# Patient Record
Sex: Male | Born: 1970 | Race: White | Hispanic: No | Smoking: Never smoker
Health system: Southern US, Community
[De-identification: ages and names within clinical notes are randomized; demographics above are authoritative.]

## PROBLEM LIST (undated history)

## (undated) DIAGNOSIS — J45909 Unspecified asthma, uncomplicated: Secondary | ICD-10-CM

## (undated) DIAGNOSIS — G4733 Obstructive sleep apnea (adult) (pediatric): Secondary | ICD-10-CM

## (undated) DIAGNOSIS — I10 Essential (primary) hypertension: Secondary | ICD-10-CM

## (undated) DIAGNOSIS — E785 Hyperlipidemia, unspecified: Secondary | ICD-10-CM

## (undated) HISTORY — DX: Hyperlipidemia, unspecified: E78.5

## (undated) HISTORY — DX: Essential (primary) hypertension: I10

## (undated) HISTORY — DX: Unspecified asthma, uncomplicated: J45.909

## (undated) HISTORY — DX: Obstructive sleep apnea (adult) (pediatric): G47.33

---

## 1998-10-03 HISTORY — PX: NASAL SINUS SURGERY: SHX719

## 1999-10-07 ENCOUNTER — Encounter: Payer: Self-pay | Admitting: Emergency Medicine

## 1999-10-07 ENCOUNTER — Inpatient Hospital Stay (HOSPITAL_COMMUNITY): Admission: EM | Admit: 1999-10-07 | Discharge: 1999-10-08 | Payer: Self-pay | Admitting: Emergency Medicine

## 1999-11-06 ENCOUNTER — Emergency Department (HOSPITAL_COMMUNITY): Admission: EM | Admit: 1999-11-06 | Discharge: 1999-11-06 | Payer: Self-pay | Admitting: Emergency Medicine

## 1999-11-07 ENCOUNTER — Encounter: Payer: Self-pay | Admitting: Emergency Medicine

## 1999-12-20 ENCOUNTER — Encounter: Payer: Self-pay | Admitting: *Deleted

## 1999-12-20 ENCOUNTER — Ambulatory Visit (HOSPITAL_COMMUNITY): Admission: RE | Admit: 1999-12-20 | Discharge: 1999-12-20 | Payer: Self-pay | Admitting: *Deleted

## 2000-02-25 ENCOUNTER — Encounter: Payer: Self-pay | Admitting: *Deleted

## 2000-02-25 ENCOUNTER — Ambulatory Visit (HOSPITAL_COMMUNITY): Admission: RE | Admit: 2000-02-25 | Discharge: 2000-02-25 | Payer: Self-pay | Admitting: *Deleted

## 2000-04-14 ENCOUNTER — Encounter: Admission: RE | Admit: 2000-04-14 | Discharge: 2000-04-14 | Payer: Self-pay | Admitting: Otolaryngology

## 2000-04-14 ENCOUNTER — Encounter: Payer: Self-pay | Admitting: Otolaryngology

## 2000-06-22 ENCOUNTER — Encounter (INDEPENDENT_AMBULATORY_CARE_PROVIDER_SITE_OTHER): Payer: Self-pay | Admitting: Specialist

## 2000-06-22 ENCOUNTER — Other Ambulatory Visit: Admission: RE | Admit: 2000-06-22 | Discharge: 2000-06-22 | Payer: Self-pay | Admitting: Otolaryngology

## 2006-02-02 ENCOUNTER — Encounter: Admission: RE | Admit: 2006-02-02 | Discharge: 2006-02-02 | Payer: Self-pay | Admitting: Endocrinology

## 2006-02-23 ENCOUNTER — Encounter: Admission: RE | Admit: 2006-02-23 | Discharge: 2006-02-23 | Payer: Self-pay | Admitting: Endocrinology

## 2009-12-11 ENCOUNTER — Encounter: Admission: RE | Admit: 2009-12-11 | Discharge: 2009-12-11 | Payer: Self-pay | Admitting: Family Medicine

## 2012-09-20 ENCOUNTER — Other Ambulatory Visit: Payer: Self-pay | Admitting: Family Medicine

## 2012-09-20 DIAGNOSIS — R7989 Other specified abnormal findings of blood chemistry: Secondary | ICD-10-CM

## 2012-09-24 ENCOUNTER — Ambulatory Visit
Admission: RE | Admit: 2012-09-24 | Discharge: 2012-09-24 | Disposition: A | Discharge status: Home / Self Care | Payer: BC Managed Care – PPO | Source: Ambulatory Visit | Attending: Family Medicine | Admitting: Family Medicine

## 2012-09-24 DIAGNOSIS — R7989 Other specified abnormal findings of blood chemistry: Secondary | ICD-10-CM

## 2012-11-30 ENCOUNTER — Ambulatory Visit
Admission: RE | Admit: 2012-11-30 | Discharge: 2012-11-30 | Disposition: A | Discharge status: Home / Self Care | Payer: BC Managed Care – PPO | Source: Ambulatory Visit | Attending: Family Medicine | Admitting: Family Medicine

## 2012-11-30 ENCOUNTER — Other Ambulatory Visit: Payer: Self-pay | Admitting: Family Medicine

## 2012-11-30 DIAGNOSIS — R109 Unspecified abdominal pain: Secondary | ICD-10-CM

## 2012-11-30 MED ORDER — IOHEXOL 300 MG/ML  SOLN
125.0000 mL | Freq: Once | INTRAMUSCULAR | Status: AC | PRN
  Administered 2012-11-30: 125 mL via INTRAVENOUS

## 2013-01-04 ENCOUNTER — Institutional Professional Consult (permissible substitution): Payer: BC Managed Care – PPO | Admitting: Internal Medicine

## 2013-01-11 ENCOUNTER — Ambulatory Visit (INDEPENDENT_AMBULATORY_CARE_PROVIDER_SITE_OTHER)
Admission: RE | Admit: 2013-01-11 | Discharge: 2013-01-11 | Disposition: A | Discharge status: Home / Self Care | Payer: BC Managed Care – PPO | Source: Ambulatory Visit | Attending: Internal Medicine | Admitting: Internal Medicine

## 2013-01-11 ENCOUNTER — Encounter: Payer: Self-pay | Admitting: Internal Medicine

## 2013-01-11 ENCOUNTER — Ambulatory Visit (INDEPENDENT_AMBULATORY_CARE_PROVIDER_SITE_OTHER): Payer: BC Managed Care – PPO | Admitting: Internal Medicine

## 2013-01-11 ENCOUNTER — Other Ambulatory Visit (INDEPENDENT_AMBULATORY_CARE_PROVIDER_SITE_OTHER): Payer: BC Managed Care – PPO

## 2013-01-11 VITALS — BP 126/82 | HR 87 | Temp 98.0°F | Ht 78.0 in | Wt 282.0 lb

## 2013-01-11 DIAGNOSIS — J45909 Unspecified asthma, uncomplicated: Secondary | ICD-10-CM

## 2013-01-11 DIAGNOSIS — I10 Essential (primary) hypertension: Secondary | ICD-10-CM

## 2013-01-11 LAB — CBC WITH DIFFERENTIAL/PLATELET
Basophils Relative: 0.6 % (ref 0.0–3.0)
Eosinophils Relative: 4.1 % (ref 0.0–5.0)
Lymphs Abs: 1.5 10*3/uL (ref 0.7–4.0)
Monocytes Absolute: 0.5 10*3/uL (ref 0.1–1.0)
Neutrophils Relative %: 66.5 % (ref 43.0–77.0)
Platelets: 214 10*3/uL (ref 150.0–400.0)
WBC: 6.8 10*3/uL (ref 4.5–10.5)

## 2013-01-11 LAB — ALLERGY PROFILE REGION II-DC, DE, MD, ~~LOC~~, VA
Alternaria Alternata: <0.1 kU/L
Aspergillus fumigatus, m3: <0.1 kU/L
Box Elder IgE: 0.69 kU/L — ABNORMAL HIGH
Cat Dander: <0.1 kU/L
Cladosporium Herbarum: <0.1 kU/L
Common Ragweed: <0.1 kU/L
D. farinae: <0.1 kU/L
Elm IgE: <0.1 kU/L
Lamb's Quarters: <0.1 kU/L
Pecan/Hickory Tree IgE: <0.1 kU/L

## 2013-01-11 MED ORDER — OLMESARTAN MEDOXOMIL 20 MG PO TABS: 20.0000 mg | ORAL_TABLET | Freq: Every day | ORAL | Status: DC

## 2013-01-11 NOTE — Progress Notes (Signed)
  Subjective:    Patient ID: Mike Shaw, male    DOB: 1971-01-07  MRN: [id number]  HPI  71 yowm never smoker with sinus problems as child ? With "polyps"  seen in [place] by Allergist then started wheezing 1995  then around 2000 sinus surgery which seemed to help but since 2012 much worse control on first advair then on symbicort so referred 01/11/2013 by Dr Janace Litten to pulmonary clinic.   01/11/2013 1st pulmonary eval cc daily sinus congestion and sense of excess drainage but < tbsp of white mucus in ams and  worse sob with temperature change and with pollen exp using both hfa saba 3-4 per day and neb x 3 week despite rx with symbicort 160 (note inhaler he brought is empty).    No obvious daytime variabilty or assoc chronic cough or cp or chest tightness, subjective wheeze overt sinus or hb symptoms. No unusual exp hx or h/o childhood pna/ asthma or premature birth to his knowledge.   Sleeping ok without nocturnal  or early am exacerbation  of respiratory  c/o's or need for noct saba. Also denies any obvious fluctuation of symptoms with weather or environmental changes or other aggravating or alleviating factors except as outlined above   Review of Systems  Constitutional: Negative for fever, chills, activity change, appetite change and unexpected weight change.  HENT: Negative for congestion, sore throat, rhinorrhea, sneezing, trouble swallowing, dental problem, voice change and postnasal drip.   Eyes: Negative for visual disturbance.  Respiratory: Positive for cough and shortness of breath. Negative for choking.   Cardiovascular: Negative for chest pain and leg swelling.  Gastrointestinal: Positive for abdominal pain. Negative for nausea and vomiting.  Genitourinary: Negative for difficulty urinating.  Musculoskeletal: Positive for arthralgias.  Skin: Negative for rash.  Psychiatric/Behavioral: Negative for behavioral problems and confusion.       Objective:   Physical Exam  Wt Readings  from Last 3 Encounters:  01/11/13 282 lb (127.914 kg)   HEENT: nl dentition, turbinates, and orophanx. Nl external ear canals without cough reflex   NECK :  without JVD/Nodes/TM/ nl carotid upstrokes bilaterally   LUNGS: no acc muscle use, clear to A and P bilaterally without cough on insp or exp maneuvers   CV:  RRR  no s3 or murmur or increase in P2, no edema   ABD:  soft and nontender with nl excursion in the supine position. No bruits or organomegaly, bowel sounds nl  MS:  warm without deformities, calf tenderness, cyanosis or clubbing  SKIN: warm and dry without lesions    NEURO:  alert, approp, no deficits    CXR  01/11/2013 :    No acute disease.         Assessment & Plan:

## 2013-01-11 NOTE — Assessment & Plan Note (Addendum)
ACE inhibitors are problematic in  pts with airway complaints because  even experienced pulmonologists can't always distinguish ace effects from copd/asthma.  By themselves they don't actually cause a problem, much like oxygen can't by itself start a fire, but they certainly serve as a powerful catalyst or enhancer for any "fire"  or inflammatory process in the upper airway, be it caused by an ET  tube or more commonly reflux (especially in the obese or pts with known GERD or who are on biphoshonates).    In the era of ARB near equivalency until we have a better handle on the reversibility of the airway problem, it just makes sense to avoid ACEI  entirely in the short run and then decide later, having established a level of airway control using a reasonable limited regimen, whether to add back ace but even then being very careful to observe the pt for worsening airway control and number of meds used/ needed to control symptoms.   Try benicar 20 mg samples for now then regroup  See instructions for specific recommendations which were reviewed directly with the patient who was given a copy with highlighter outlining the key components.

## 2013-01-11 NOTE — Patient Instructions (Addendum)
Stop lisinopril (your blood pressure pill  Start new blood pressure pill =  benicar 20 mg one daily break in half if too strong   symbicort 160 Take 2 puffs first thing in am and then another 2 puffs about 12 hours later.    Only use your albuterol (1st use the proaire, then the nebulizer) as a rescue medication to be used if you can't catch your breath by resting or doing a relaxed purse lip breathing pattern. The less you use it, the better it will work when you need it.   Please remember to go to the lab and x-ray department downstairs for your tests - we will call you with the results when they are available.  Please schedule a follow up office visit in 4 weeks, sooner if needed with pfts bring all meds with you

## 2013-01-11 NOTE — Assessment & Plan Note (Signed)
DDX of  difficult airways managment all start with A and  include Adherence, Ace Inhibitors, Acid Reflux, Active Sinus Disease, Alpha 1 Antitripsin deficiency, Anxiety masquerading as Airways dz,  ABPA,  allergy(esp in young), Aspiration (esp in elderly), Adverse effects of DPI,  Active smokers, plus two Bs  = Bronchiectasis and Beta blocker use..and one C= CHF  Adherence is always the initial "prime suspect" and is a multilayered concern that requires a "trust but verify" approach in every patient - starting with knowing how to use medications, especially inhalers, correctly, keeping up with refills and understanding the fundamental difference between maintenance and prns vs those medications only taken for a very short course and then stopped and not refilled. The proper method of use, as well as anticipated side effects, of a metered-dose inhaler are discussed and demonstrated to the patient. Improved effectiveness after extensive coaching during this visit to a level of approximately  75% so continue symbicort 160 2bid but verify he's using it correctly at each ov  ? acei side effect > try off x at least 4 weeks

## 2013-01-14 ENCOUNTER — Telehealth: Payer: Self-pay | Admitting: Internal Medicine

## 2013-01-14 MED ORDER — OLMESARTAN MEDOXOMIL 40 MG PO TABS: 40.0000 mg | ORAL_TABLET | Freq: Every day | ORAL | Status: AC

## 2013-01-14 MED ORDER — PREDNISONE 10 MG PO TABS: ORAL_TABLET | ORAL | Status: DC

## 2013-01-14 NOTE — Telephone Encounter (Signed)
i don't mind pred rx x 6 days but it won't work well if he continues acei and I though we gave him enough benicar to last until next ov when we could regroup on the issue - my intention wasn't to commit to the benicar any longer than it took to sort out the problem  Prednisone 10 mg take  4 each am x 2 days,   2 each am x 2 days,  1 each am x2days and stop

## 2013-01-14 NOTE — Telephone Encounter (Signed)
I called the pt to give him results of labs and cxr He states that MW mentioned calling in pred taper at last ov but this was never sent I do not see where this was mentioned on AVS Please advise if you want me to call this in He also says ins denied benicar and so he is still taking the ACE Please advise alternative, thanks!

## 2013-01-14 NOTE — Progress Notes (Signed)
Quick Note:  Spoke with pt and notified of results per Dr. Wert. Pt verbalized understanding and denied any questions.  ______ 

## 2013-01-14 NOTE — Telephone Encounter (Signed)
Pt returned call & can be reached at [phone]. Mike Shaw

## 2013-01-14 NOTE — Telephone Encounter (Signed)
Ok to give  benicar 40 mg one half daily other option bystolic 10 mg one daily

## 2013-01-14 NOTE — Telephone Encounter (Signed)
rx sent. Sample left of Benicar  40mg  at front. Pt needs to be instructed to take 1/2 tablet. Also pred taper has been sent. Carron Curie, CMA

## 2013-01-14 NOTE — Telephone Encounter (Signed)
Spoke with pt and notified of recs and he verbalized understanding Nothing further needed per pt

## 2013-01-14 NOTE — Telephone Encounter (Signed)
We don't have any of the benicar 20 mg samples Plenty of bystolic though Please advise thanks

## 2013-02-07 ENCOUNTER — Other Ambulatory Visit: Payer: Self-pay | Admitting: Internal Medicine

## 2013-02-07 DIAGNOSIS — J45909 Unspecified asthma, uncomplicated: Secondary | ICD-10-CM

## 2013-02-08 ENCOUNTER — Ambulatory Visit: Payer: BC Managed Care – PPO | Admitting: Internal Medicine

## 2013-03-08 ENCOUNTER — Ambulatory Visit (INDEPENDENT_AMBULATORY_CARE_PROVIDER_SITE_OTHER): Payer: BC Managed Care – PPO | Admitting: Internal Medicine

## 2013-03-08 ENCOUNTER — Encounter: Payer: Self-pay | Admitting: Internal Medicine

## 2013-03-08 ENCOUNTER — Ambulatory Visit (HOSPITAL_COMMUNITY)
Admission: RE | Admit: 2013-03-08 | Discharge: 2013-03-08 | Disposition: A | Discharge status: Home / Self Care | Payer: BC Managed Care – PPO | Source: Ambulatory Visit | Attending: Internal Medicine | Admitting: Internal Medicine

## 2013-03-08 ENCOUNTER — Encounter: Payer: Self-pay | Admitting: *Deleted

## 2013-03-08 VITALS — BP 122/84 | HR 81 | Temp 98.4°F | Ht 77.0 in | Wt 292.0 lb

## 2013-03-08 DIAGNOSIS — I10 Essential (primary) hypertension: Secondary | ICD-10-CM

## 2013-03-08 DIAGNOSIS — R0609 Other forms of dyspnea: Secondary | ICD-10-CM | POA: Insufficient documentation

## 2013-03-08 DIAGNOSIS — R0989 Other specified symptoms and signs involving the circulatory and respiratory systems: Secondary | ICD-10-CM | POA: Insufficient documentation

## 2013-03-08 DIAGNOSIS — J45909 Unspecified asthma, uncomplicated: Secondary | ICD-10-CM | POA: Insufficient documentation

## 2013-03-08 LAB — PULMONARY FUNCTION TEST

## 2013-03-08 MED ORDER — ALBUTEROL SULFATE (5 MG/ML) 0.5% IN NEBU
2.5000 mg | INHALATION_SOLUTION | Freq: Once | RESPIRATORY_TRACT | Status: AC
  Administered 2013-03-08: 2.5 mg via RESPIRATORY_TRACT

## 2013-03-08 NOTE — Patient Instructions (Addendum)
Symbicort 160 up 2 puffs every 12 hours if needed for breathing but your lung function off symbicort today is perfectly normal suggesting you may not even have asthma at all  It may well be that most of your symptoms were related to the lisinopril and if you stay off this class of blood pressure pill - your drug formulary will need to be used to find the substitute    If you are satisfied with your treatment plan let your doctor know and he/she can either refill your medications or you can return here when your prescription runs out.     If in any way you are not 100% satisfied,  please tell us.  If 100% better, tell your friends!   Ok to wear a respirator at work

## 2013-03-08 NOTE — Progress Notes (Signed)
  Subjective:    Patient ID: Mike Shaw, male    DOB: 03-09-1971  MRN: [id number]  HPI  35 yowm never smoker with sinus problems as child ? With "polyps"  seen in [place] by Allergist then started wheezing 1995  then around 2000 sinus surgery which seemed to help but since 2012 much worse control on first advair then on symbicort so referred 01/11/2013 by Dr Janace Litten to pulmonary clinic.   01/11/2013 1st pulmonary eval cc daily sinus congestion and sense of excess drainage but < tbsp of white mucus in ams and  worse sob with temperature change and with pollen exp using both hfa saba 3-4 per day and neb x 3 week despite rx with symbicort 160 (note inhaler he brought is empty).   rec Stop lisinopril (your blood pressure pill Start new blood pressure pill =  benicar 20 mg one daily break in half if too strong  symbicort 160 Take 2 puffs first thing in am and then another 2 puffs about 12 hours later.  Only use your albuterol (1st use the proaire, then the nebulizer) as a rescue medication  03/08/2013 f/u ov/Nikeya Maxim on symbicort  Chief Complaint  Patient presents with  . Followup with PFT    Pt states cough and breathing have improved since last visit. No new co's today.    rare need for albuterol  No obvious daytime variabilty or assoc chronic cough or cp or chest tightness, subjective wheeze overt sinus or hb symptoms. No unusual exp hx or h/o childhood pna/ asthma or premature birth to his knowledge.   Sleeping ok without nocturnal  or early am exacerbation  of respiratory  c/o's or need for noct saba. Also denies any obvious fluctuation of symptoms with weather or environmental changes or other aggravating or alleviating factors except as outlined above   Current Medications, Allergies, Past Medical History, Past Surgical History, Family History, and Social History were reviewed in [employer] record.  ROS  The following are not active complaints unless bolded sore throat,  dysphagia, dental problems, itching, sneezing,  nasal congestion or excess/ purulent secretions, ear ache,   fever, chills, sweats, unintended wt loss, pleuritic or exertional cp, hemoptysis,  orthopnea pnd or leg swelling, presyncope, palpitations, heartburn, abdominal pain, anorexia, nausea, vomiting, diarrhea  or change in bowel or urinary habits, change in stools or urine, dysuria,hematuria,  rash, arthralgias, visual complaints, headache, numbness weakness or ataxia or problems with walking or coordination,  change in mood/affect or memory.            Objective:   Physical Exam  03/08/2013          292  Wt Readings from Last 3 Encounters:  01/11/13 282 lb (127.914 kg)   HEENT: nl dentition, turbinates, and orophanx. Nl external ear canals without cough reflex   NECK :  without JVD/Nodes/TM/ nl carotid upstrokes bilaterally   LUNGS: no acc muscle use, clear to A and P bilaterally without cough on insp or exp maneuvers   CV:  RRR  no s3 or murmur or increase in P2, no edema   ABD:  soft and nontender with nl excursion in the supine position. No bruits or organomegaly, bowel sounds nl  MS:  warm without deformities, calf tenderness, cyanosis or clubbing     CXR  01/11/2013 :    No acute disease.         Assessment & Plan:

## 2013-03-09 NOTE — Assessment & Plan Note (Addendum)
-   hfa 75% 01/11/2013  - 01/11/2013 try off acei > all asthma symptoms resolved - 03/08/13 PFT's wnl  All goals of chronic asthma control met including optimal function and elimination of symptoms with minimal need for rescue therapy.  Contingencies discussed in full including contacting this office immediately if not controlling the symptoms using the rule of two's.

## 2013-03-10 ENCOUNTER — Encounter: Payer: Self-pay | Admitting: Internal Medicine

## 2013-03-10 NOTE — Assessment & Plan Note (Addendum)
Change acei to arb effective 01/11/2013  > difficult to control asthma symptoms resolved, bp well controlled on benicar 40 mg one half daily  Would avoid acei in this setting> continue benicar

## 2013-07-02 ENCOUNTER — Other Ambulatory Visit: Payer: Self-pay | Admitting: Orthopedic Surgery

## 2013-07-02 DIAGNOSIS — M545 Low back pain, unspecified: Secondary | ICD-10-CM

## 2013-07-12 ENCOUNTER — Other Ambulatory Visit: Payer: Self-pay | Admitting: Orthopedic Surgery

## 2013-07-12 ENCOUNTER — Ambulatory Visit
Admission: RE | Admit: 2013-07-12 | Discharge: 2013-07-12 | Disposition: A | Discharge status: Home / Self Care | Payer: BC Managed Care – PPO | Source: Ambulatory Visit | Attending: Orthopedic Surgery | Admitting: Orthopedic Surgery

## 2013-07-12 DIAGNOSIS — M545 Low back pain, unspecified: Secondary | ICD-10-CM

## 2013-07-12 DIAGNOSIS — Z77018 Contact with and (suspected) exposure to other hazardous metals: Secondary | ICD-10-CM

## 2016-02-02 DIAGNOSIS — E782 Mixed hyperlipidemia: Secondary | ICD-10-CM | POA: Diagnosis not present

## 2016-02-02 DIAGNOSIS — I10 Essential (primary) hypertension: Secondary | ICD-10-CM | POA: Diagnosis not present

## 2016-02-02 DIAGNOSIS — K219 Gastro-esophageal reflux disease without esophagitis: Secondary | ICD-10-CM | POA: Diagnosis not present

## 2016-02-02 DIAGNOSIS — J4530 Mild persistent asthma, uncomplicated: Secondary | ICD-10-CM | POA: Diagnosis not present

## 2016-04-06 DIAGNOSIS — E039 Hypothyroidism, unspecified: Secondary | ICD-10-CM | POA: Diagnosis not present

## 2016-06-17 DIAGNOSIS — H524 Presbyopia: Secondary | ICD-10-CM | POA: Diagnosis not present

## 2016-06-17 DIAGNOSIS — Z23 Encounter for immunization: Secondary | ICD-10-CM | POA: Diagnosis not present

## 2016-06-17 DIAGNOSIS — H5203 Hypermetropia, bilateral: Secondary | ICD-10-CM | POA: Diagnosis not present

## 2016-06-17 DIAGNOSIS — H52223 Regular astigmatism, bilateral: Secondary | ICD-10-CM | POA: Diagnosis not present

## 2016-08-04 DIAGNOSIS — I10 Essential (primary) hypertension: Secondary | ICD-10-CM | POA: Diagnosis not present

## 2016-08-04 DIAGNOSIS — E782 Mixed hyperlipidemia: Secondary | ICD-10-CM | POA: Diagnosis not present

## 2016-08-04 DIAGNOSIS — K219 Gastro-esophageal reflux disease without esophagitis: Secondary | ICD-10-CM | POA: Diagnosis not present

## 2016-08-04 DIAGNOSIS — J4530 Mild persistent asthma, uncomplicated: Secondary | ICD-10-CM | POA: Diagnosis not present

## 2016-12-25 DIAGNOSIS — J189 Pneumonia, unspecified organism: Secondary | ICD-10-CM | POA: Diagnosis not present

## 2016-12-25 DIAGNOSIS — R05 Cough: Secondary | ICD-10-CM | POA: Diagnosis not present

## 2017-02-01 DIAGNOSIS — E782 Mixed hyperlipidemia: Secondary | ICD-10-CM | POA: Diagnosis not present

## 2017-02-01 DIAGNOSIS — K219 Gastro-esophageal reflux disease without esophagitis: Secondary | ICD-10-CM | POA: Diagnosis not present

## 2017-02-01 DIAGNOSIS — J4530 Mild persistent asthma, uncomplicated: Secondary | ICD-10-CM | POA: Diagnosis not present

## 2017-02-01 DIAGNOSIS — I10 Essential (primary) hypertension: Secondary | ICD-10-CM | POA: Diagnosis not present

## 2017-02-03 DIAGNOSIS — R6882 Decreased libido: Secondary | ICD-10-CM | POA: Diagnosis not present

## 2017-02-24 DIAGNOSIS — G4733 Obstructive sleep apnea (adult) (pediatric): Secondary | ICD-10-CM | POA: Diagnosis not present

## 2017-02-24 DIAGNOSIS — R300 Dysuria: Secondary | ICD-10-CM | POA: Diagnosis not present

## 2017-03-16 DIAGNOSIS — G4733 Obstructive sleep apnea (adult) (pediatric): Secondary | ICD-10-CM | POA: Diagnosis not present

## 2017-03-16 DIAGNOSIS — J4520 Mild intermittent asthma, uncomplicated: Secondary | ICD-10-CM | POA: Diagnosis not present

## 2017-04-15 DIAGNOSIS — J4520 Mild intermittent asthma, uncomplicated: Secondary | ICD-10-CM | POA: Diagnosis not present

## 2017-04-19 DIAGNOSIS — L918 Other hypertrophic disorders of the skin: Secondary | ICD-10-CM | POA: Diagnosis not present

## 2017-05-15 DIAGNOSIS — G4733 Obstructive sleep apnea (adult) (pediatric): Secondary | ICD-10-CM | POA: Diagnosis not present

## 2017-05-16 DIAGNOSIS — J4520 Mild intermittent asthma, uncomplicated: Secondary | ICD-10-CM | POA: Diagnosis not present

## 2017-06-16 DIAGNOSIS — J4520 Mild intermittent asthma, uncomplicated: Secondary | ICD-10-CM | POA: Diagnosis not present

## 2017-07-10 DIAGNOSIS — J4520 Mild intermittent asthma, uncomplicated: Secondary | ICD-10-CM | POA: Diagnosis not present

## 2017-07-10 DIAGNOSIS — G4733 Obstructive sleep apnea (adult) (pediatric): Secondary | ICD-10-CM | POA: Diagnosis not present

## 2017-09-15 DIAGNOSIS — H524 Presbyopia: Secondary | ICD-10-CM | POA: Diagnosis not present

## 2017-09-15 DIAGNOSIS — H5203 Hypermetropia, bilateral: Secondary | ICD-10-CM | POA: Diagnosis not present

## 2017-09-15 DIAGNOSIS — H52223 Regular astigmatism, bilateral: Secondary | ICD-10-CM | POA: Diagnosis not present

## 2017-09-27 DIAGNOSIS — J069 Acute upper respiratory infection, unspecified: Secondary | ICD-10-CM | POA: Diagnosis not present

## 2017-09-27 DIAGNOSIS — R05 Cough: Secondary | ICD-10-CM | POA: Diagnosis not present

## 2017-10-18 DIAGNOSIS — G4733 Obstructive sleep apnea (adult) (pediatric): Secondary | ICD-10-CM | POA: Diagnosis not present

## 2017-10-18 DIAGNOSIS — K5900 Constipation, unspecified: Secondary | ICD-10-CM | POA: Diagnosis not present

## 2017-10-18 DIAGNOSIS — R52 Pain, unspecified: Secondary | ICD-10-CM | POA: Diagnosis not present

## 2017-10-18 DIAGNOSIS — K760 Fatty (change of) liver, not elsewhere classified: Secondary | ICD-10-CM | POA: Diagnosis not present

## 2017-10-18 DIAGNOSIS — J4520 Mild intermittent asthma, uncomplicated: Secondary | ICD-10-CM | POA: Diagnosis not present

## 2017-10-18 DIAGNOSIS — R109 Unspecified abdominal pain: Secondary | ICD-10-CM | POA: Diagnosis not present

## 2017-10-18 DIAGNOSIS — E785 Hyperlipidemia, unspecified: Secondary | ICD-10-CM | POA: Diagnosis not present

## 2017-10-18 DIAGNOSIS — K219 Gastro-esophageal reflux disease without esophagitis: Secondary | ICD-10-CM | POA: Diagnosis not present

## 2017-10-18 DIAGNOSIS — R599 Enlarged lymph nodes, unspecified: Secondary | ICD-10-CM | POA: Diagnosis not present

## 2017-10-18 DIAGNOSIS — K859 Acute pancreatitis without necrosis or infection, unspecified: Secondary | ICD-10-CM | POA: Diagnosis not present

## 2017-10-18 DIAGNOSIS — I10 Essential (primary) hypertension: Secondary | ICD-10-CM | POA: Diagnosis not present

## 2017-10-18 DIAGNOSIS — R1084 Generalized abdominal pain: Secondary | ICD-10-CM | POA: Diagnosis not present

## 2017-10-18 DIAGNOSIS — E039 Hypothyroidism, unspecified: Secondary | ICD-10-CM | POA: Diagnosis not present

## 2017-10-19 DIAGNOSIS — K859 Acute pancreatitis without necrosis or infection, unspecified: Secondary | ICD-10-CM | POA: Diagnosis not present

## 2017-10-19 DIAGNOSIS — E039 Hypothyroidism, unspecified: Secondary | ICD-10-CM | POA: Diagnosis not present

## 2017-10-19 DIAGNOSIS — E785 Hyperlipidemia, unspecified: Secondary | ICD-10-CM | POA: Diagnosis not present

## 2017-10-19 DIAGNOSIS — I10 Essential (primary) hypertension: Secondary | ICD-10-CM | POA: Diagnosis not present

## 2017-10-19 DIAGNOSIS — R599 Enlarged lymph nodes, unspecified: Secondary | ICD-10-CM | POA: Diagnosis not present

## 2017-10-20 DIAGNOSIS — I10 Essential (primary) hypertension: Secondary | ICD-10-CM | POA: Diagnosis not present

## 2017-10-20 DIAGNOSIS — R599 Enlarged lymph nodes, unspecified: Secondary | ICD-10-CM | POA: Diagnosis not present

## 2017-10-20 DIAGNOSIS — G4733 Obstructive sleep apnea (adult) (pediatric): Secondary | ICD-10-CM | POA: Diagnosis not present

## 2017-10-20 DIAGNOSIS — E785 Hyperlipidemia, unspecified: Secondary | ICD-10-CM | POA: Diagnosis not present

## 2017-10-20 DIAGNOSIS — K859 Acute pancreatitis without necrosis or infection, unspecified: Secondary | ICD-10-CM | POA: Diagnosis not present

## 2017-10-20 DIAGNOSIS — J4520 Mild intermittent asthma, uncomplicated: Secondary | ICD-10-CM | POA: Diagnosis not present

## 2017-10-21 DIAGNOSIS — K859 Acute pancreatitis without necrosis or infection, unspecified: Secondary | ICD-10-CM | POA: Diagnosis not present

## 2017-10-21 DIAGNOSIS — E785 Hyperlipidemia, unspecified: Secondary | ICD-10-CM | POA: Diagnosis not present

## 2017-10-21 DIAGNOSIS — I10 Essential (primary) hypertension: Secondary | ICD-10-CM | POA: Diagnosis not present

## 2017-10-21 DIAGNOSIS — R599 Enlarged lymph nodes, unspecified: Secondary | ICD-10-CM | POA: Diagnosis not present

## 2017-10-22 DIAGNOSIS — I10 Essential (primary) hypertension: Secondary | ICD-10-CM | POA: Diagnosis not present

## 2017-10-22 DIAGNOSIS — K859 Acute pancreatitis without necrosis or infection, unspecified: Secondary | ICD-10-CM | POA: Diagnosis not present

## 2017-10-22 DIAGNOSIS — E785 Hyperlipidemia, unspecified: Secondary | ICD-10-CM | POA: Diagnosis not present

## 2017-10-23 DIAGNOSIS — K859 Acute pancreatitis without necrosis or infection, unspecified: Secondary | ICD-10-CM | POA: Diagnosis not present

## 2017-10-23 DIAGNOSIS — I10 Essential (primary) hypertension: Secondary | ICD-10-CM | POA: Diagnosis not present

## 2017-10-23 DIAGNOSIS — E785 Hyperlipidemia, unspecified: Secondary | ICD-10-CM | POA: Diagnosis not present

## 2017-10-25 DIAGNOSIS — K859 Acute pancreatitis without necrosis or infection, unspecified: Secondary | ICD-10-CM | POA: Diagnosis not present

## 2017-11-02 DIAGNOSIS — K859 Acute pancreatitis without necrosis or infection, unspecified: Secondary | ICD-10-CM | POA: Diagnosis not present

## 2017-11-14 DIAGNOSIS — Z01818 Encounter for other preprocedural examination: Secondary | ICD-10-CM | POA: Diagnosis not present

## 2017-11-14 DIAGNOSIS — K859 Acute pancreatitis without necrosis or infection, unspecified: Secondary | ICD-10-CM | POA: Diagnosis not present

## 2017-11-14 DIAGNOSIS — R150 Incomplete defecation: Secondary | ICD-10-CM | POA: Diagnosis not present

## 2017-11-24 DIAGNOSIS — J209 Acute bronchitis, unspecified: Secondary | ICD-10-CM | POA: Diagnosis not present

## 2017-12-03 DIAGNOSIS — J029 Acute pharyngitis, unspecified: Secondary | ICD-10-CM | POA: Diagnosis not present

## 2017-12-03 DIAGNOSIS — J019 Acute sinusitis, unspecified: Secondary | ICD-10-CM | POA: Diagnosis not present

## 2017-12-06 DIAGNOSIS — K861 Other chronic pancreatitis: Secondary | ICD-10-CM | POA: Diagnosis not present

## 2017-12-06 DIAGNOSIS — Z1211 Encounter for screening for malignant neoplasm of colon: Secondary | ICD-10-CM | POA: Diagnosis not present

## 2017-12-06 DIAGNOSIS — D122 Benign neoplasm of ascending colon: Secondary | ICD-10-CM | POA: Diagnosis not present

## 2017-12-06 DIAGNOSIS — D125 Benign neoplasm of sigmoid colon: Secondary | ICD-10-CM | POA: Diagnosis not present

## 2017-12-06 DIAGNOSIS — K648 Other hemorrhoids: Secondary | ICD-10-CM | POA: Diagnosis not present

## 2017-12-11 DIAGNOSIS — K219 Gastro-esophageal reflux disease without esophagitis: Secondary | ICD-10-CM | POA: Diagnosis not present

## 2017-12-11 DIAGNOSIS — E782 Mixed hyperlipidemia: Secondary | ICD-10-CM | POA: Diagnosis not present

## 2017-12-11 DIAGNOSIS — E039 Hypothyroidism, unspecified: Secondary | ICD-10-CM | POA: Diagnosis not present

## 2017-12-11 DIAGNOSIS — I10 Essential (primary) hypertension: Secondary | ICD-10-CM | POA: Diagnosis not present

## 2017-12-11 DIAGNOSIS — Z0000 Encounter for general adult medical examination without abnormal findings: Secondary | ICD-10-CM | POA: Diagnosis not present

## 2018-03-11 DIAGNOSIS — J45901 Unspecified asthma with (acute) exacerbation: Secondary | ICD-10-CM | POA: Diagnosis not present

## 2018-03-15 DIAGNOSIS — M25562 Pain in left knee: Secondary | ICD-10-CM | POA: Diagnosis not present

## 2018-03-15 DIAGNOSIS — M1711 Unilateral primary osteoarthritis, right knee: Secondary | ICD-10-CM | POA: Diagnosis not present

## 2018-03-15 DIAGNOSIS — M25572 Pain in left ankle and joints of left foot: Secondary | ICD-10-CM | POA: Diagnosis not present

## 2018-03-15 DIAGNOSIS — M25561 Pain in right knee: Secondary | ICD-10-CM | POA: Diagnosis not present

## 2018-03-15 DIAGNOSIS — M1712 Unilateral primary osteoarthritis, left knee: Secondary | ICD-10-CM | POA: Diagnosis not present

## 2018-08-13 DIAGNOSIS — J4521 Mild intermittent asthma with (acute) exacerbation: Secondary | ICD-10-CM | POA: Diagnosis not present

## 2018-08-27 DIAGNOSIS — Z23 Encounter for immunization: Secondary | ICD-10-CM | POA: Diagnosis not present

## 2018-10-18 DIAGNOSIS — J399 Disease of upper respiratory tract, unspecified: Secondary | ICD-10-CM | POA: Diagnosis not present

## 2019-01-04 DIAGNOSIS — K219 Gastro-esophageal reflux disease without esophagitis: Secondary | ICD-10-CM | POA: Diagnosis not present

## 2019-01-04 DIAGNOSIS — E039 Hypothyroidism, unspecified: Secondary | ICD-10-CM | POA: Diagnosis not present

## 2019-01-04 DIAGNOSIS — E782 Mixed hyperlipidemia: Secondary | ICD-10-CM | POA: Diagnosis not present

## 2019-01-04 DIAGNOSIS — I10 Essential (primary) hypertension: Secondary | ICD-10-CM | POA: Diagnosis not present

## 2019-03-06 DIAGNOSIS — Z20828 Contact with and (suspected) exposure to other viral communicable diseases: Secondary | ICD-10-CM | POA: Diagnosis not present

## 2019-04-02 DIAGNOSIS — J4520 Mild intermittent asthma, uncomplicated: Secondary | ICD-10-CM | POA: Diagnosis not present

## 2019-04-02 DIAGNOSIS — M545 Low back pain: Secondary | ICD-10-CM | POA: Diagnosis not present

## 2019-04-02 DIAGNOSIS — M9905 Segmental and somatic dysfunction of pelvic region: Secondary | ICD-10-CM | POA: Diagnosis not present

## 2019-04-02 DIAGNOSIS — M9901 Segmental and somatic dysfunction of cervical region: Secondary | ICD-10-CM | POA: Diagnosis not present

## 2019-04-02 DIAGNOSIS — G4733 Obstructive sleep apnea (adult) (pediatric): Secondary | ICD-10-CM | POA: Diagnosis not present

## 2019-04-02 DIAGNOSIS — M542 Cervicalgia: Secondary | ICD-10-CM | POA: Diagnosis not present

## 2019-04-04 DIAGNOSIS — M9901 Segmental and somatic dysfunction of cervical region: Secondary | ICD-10-CM | POA: Diagnosis not present

## 2019-04-04 DIAGNOSIS — M542 Cervicalgia: Secondary | ICD-10-CM | POA: Diagnosis not present

## 2019-04-04 DIAGNOSIS — M545 Low back pain: Secondary | ICD-10-CM | POA: Diagnosis not present

## 2019-04-04 DIAGNOSIS — M9905 Segmental and somatic dysfunction of pelvic region: Secondary | ICD-10-CM | POA: Diagnosis not present

## 2019-04-05 DIAGNOSIS — M542 Cervicalgia: Secondary | ICD-10-CM | POA: Diagnosis not present

## 2019-04-05 DIAGNOSIS — M9901 Segmental and somatic dysfunction of cervical region: Secondary | ICD-10-CM | POA: Diagnosis not present

## 2019-04-05 DIAGNOSIS — M545 Low back pain: Secondary | ICD-10-CM | POA: Diagnosis not present

## 2019-04-05 DIAGNOSIS — M9905 Segmental and somatic dysfunction of pelvic region: Secondary | ICD-10-CM | POA: Diagnosis not present

## 2019-04-08 DIAGNOSIS — R509 Fever, unspecified: Secondary | ICD-10-CM | POA: Diagnosis not present

## 2019-04-08 DIAGNOSIS — R110 Nausea: Secondary | ICD-10-CM | POA: Diagnosis not present

## 2019-04-11 DIAGNOSIS — Z20828 Contact with and (suspected) exposure to other viral communicable diseases: Secondary | ICD-10-CM | POA: Diagnosis not present

## 2019-04-11 DIAGNOSIS — R9431 Abnormal electrocardiogram [ECG] [EKG]: Secondary | ICD-10-CM | POA: Diagnosis not present

## 2019-04-11 DIAGNOSIS — R1110 Vomiting, unspecified: Secondary | ICD-10-CM | POA: Diagnosis not present

## 2019-04-11 DIAGNOSIS — J45909 Unspecified asthma, uncomplicated: Secondary | ICD-10-CM | POA: Diagnosis not present

## 2019-04-11 DIAGNOSIS — Z79899 Other long term (current) drug therapy: Secondary | ICD-10-CM | POA: Diagnosis not present

## 2019-04-11 DIAGNOSIS — E785 Hyperlipidemia, unspecified: Secondary | ICD-10-CM | POA: Diagnosis not present

## 2019-04-11 DIAGNOSIS — N289 Disorder of kidney and ureter, unspecified: Secondary | ICD-10-CM | POA: Diagnosis not present

## 2019-04-11 DIAGNOSIS — R509 Fever, unspecified: Secondary | ICD-10-CM | POA: Diagnosis not present

## 2019-04-11 DIAGNOSIS — N179 Acute kidney failure, unspecified: Secondary | ICD-10-CM | POA: Diagnosis not present

## 2019-04-11 DIAGNOSIS — Z6836 Body mass index (BMI) 36.0-36.9, adult: Secondary | ICD-10-CM | POA: Diagnosis not present

## 2019-04-11 DIAGNOSIS — R062 Wheezing: Secondary | ICD-10-CM | POA: Diagnosis not present

## 2019-04-11 DIAGNOSIS — R0989 Other specified symptoms and signs involving the circulatory and respiratory systems: Secondary | ICD-10-CM | POA: Diagnosis not present

## 2019-04-11 DIAGNOSIS — Z7951 Long term (current) use of inhaled steroids: Secondary | ICD-10-CM | POA: Diagnosis not present

## 2019-04-11 DIAGNOSIS — J189 Pneumonia, unspecified organism: Secondary | ICD-10-CM | POA: Diagnosis not present

## 2019-04-11 DIAGNOSIS — E669 Obesity, unspecified: Secondary | ICD-10-CM | POA: Diagnosis not present

## 2019-04-11 DIAGNOSIS — I10 Essential (primary) hypertension: Secondary | ICD-10-CM | POA: Diagnosis not present

## 2019-04-11 DIAGNOSIS — R197 Diarrhea, unspecified: Secondary | ICD-10-CM | POA: Diagnosis not present

## 2019-04-11 DIAGNOSIS — E860 Dehydration: Secondary | ICD-10-CM | POA: Diagnosis not present

## 2019-04-11 DIAGNOSIS — A419 Sepsis, unspecified organism: Secondary | ICD-10-CM | POA: Diagnosis not present

## 2019-04-11 DIAGNOSIS — E890 Postprocedural hypothyroidism: Secondary | ICD-10-CM | POA: Diagnosis not present

## 2019-04-11 DIAGNOSIS — R112 Nausea with vomiting, unspecified: Secondary | ICD-10-CM | POA: Diagnosis not present

## 2019-04-11 DIAGNOSIS — K760 Fatty (change of) liver, not elsewhere classified: Secondary | ICD-10-CM | POA: Diagnosis not present

## 2019-04-11 DIAGNOSIS — R110 Nausea: Secondary | ICD-10-CM | POA: Diagnosis not present

## 2019-04-22 DIAGNOSIS — M9901 Segmental and somatic dysfunction of cervical region: Secondary | ICD-10-CM | POA: Diagnosis not present

## 2019-04-22 DIAGNOSIS — M542 Cervicalgia: Secondary | ICD-10-CM | POA: Diagnosis not present

## 2019-04-22 DIAGNOSIS — M9905 Segmental and somatic dysfunction of pelvic region: Secondary | ICD-10-CM | POA: Diagnosis not present

## 2019-04-22 DIAGNOSIS — M545 Low back pain: Secondary | ICD-10-CM | POA: Diagnosis not present

## 2019-04-24 DIAGNOSIS — M542 Cervicalgia: Secondary | ICD-10-CM | POA: Diagnosis not present

## 2019-04-24 DIAGNOSIS — M9905 Segmental and somatic dysfunction of pelvic region: Secondary | ICD-10-CM | POA: Diagnosis not present

## 2019-04-24 DIAGNOSIS — M545 Low back pain: Secondary | ICD-10-CM | POA: Diagnosis not present

## 2019-04-24 DIAGNOSIS — M9901 Segmental and somatic dysfunction of cervical region: Secondary | ICD-10-CM | POA: Diagnosis not present

## 2019-04-29 DIAGNOSIS — Z20828 Contact with and (suspected) exposure to other viral communicable diseases: Secondary | ICD-10-CM | POA: Diagnosis not present

## 2019-05-13 DIAGNOSIS — I10 Essential (primary) hypertension: Secondary | ICD-10-CM | POA: Diagnosis not present

## 2019-05-13 DIAGNOSIS — H524 Presbyopia: Secondary | ICD-10-CM | POA: Diagnosis not present

## 2019-05-13 DIAGNOSIS — J189 Pneumonia, unspecified organism: Secondary | ICD-10-CM | POA: Diagnosis not present

## 2019-05-13 DIAGNOSIS — H52223 Regular astigmatism, bilateral: Secondary | ICD-10-CM | POA: Diagnosis not present

## 2019-05-13 DIAGNOSIS — E039 Hypothyroidism, unspecified: Secondary | ICD-10-CM | POA: Diagnosis not present

## 2019-05-13 DIAGNOSIS — H5203 Hypermetropia, bilateral: Secondary | ICD-10-CM | POA: Diagnosis not present

## 2019-05-13 DIAGNOSIS — E785 Hyperlipidemia, unspecified: Secondary | ICD-10-CM | POA: Diagnosis not present

## 2019-05-20 DIAGNOSIS — E785 Hyperlipidemia, unspecified: Secondary | ICD-10-CM | POA: Diagnosis not present

## 2019-05-20 DIAGNOSIS — R5383 Other fatigue: Secondary | ICD-10-CM | POA: Diagnosis not present

## 2019-05-20 DIAGNOSIS — E039 Hypothyroidism, unspecified: Secondary | ICD-10-CM | POA: Diagnosis not present

## 2019-05-20 DIAGNOSIS — I10 Essential (primary) hypertension: Secondary | ICD-10-CM | POA: Diagnosis not present

## 2019-07-20 DIAGNOSIS — E782 Mixed hyperlipidemia: Secondary | ICD-10-CM | POA: Diagnosis not present

## 2019-07-20 DIAGNOSIS — I10 Essential (primary) hypertension: Secondary | ICD-10-CM | POA: Diagnosis not present

## 2019-07-20 DIAGNOSIS — K219 Gastro-esophageal reflux disease without esophagitis: Secondary | ICD-10-CM | POA: Diagnosis not present

## 2019-07-20 DIAGNOSIS — E039 Hypothyroidism, unspecified: Secondary | ICD-10-CM | POA: Diagnosis not present

## 2019-09-05 DIAGNOSIS — G4733 Obstructive sleep apnea (adult) (pediatric): Secondary | ICD-10-CM | POA: Diagnosis not present

## 2019-09-05 DIAGNOSIS — J4520 Mild intermittent asthma, uncomplicated: Secondary | ICD-10-CM | POA: Diagnosis not present

## 2019-11-13 DIAGNOSIS — K429 Umbilical hernia without obstruction or gangrene: Secondary | ICD-10-CM | POA: Diagnosis not present

## 2019-11-13 DIAGNOSIS — M6208 Separation of muscle (nontraumatic), other site: Secondary | ICD-10-CM | POA: Diagnosis not present

## 2019-11-13 DIAGNOSIS — H1010 Acute atopic conjunctivitis, unspecified eye: Secondary | ICD-10-CM | POA: Diagnosis not present

## 2019-11-20 DIAGNOSIS — H524 Presbyopia: Secondary | ICD-10-CM | POA: Diagnosis not present

## 2019-11-20 DIAGNOSIS — Z135 Encounter for screening for eye and ear disorders: Secondary | ICD-10-CM | POA: Diagnosis not present

## 2020-03-25 DIAGNOSIS — Y9301 Activity, walking, marching and hiking: Secondary | ICD-10-CM | POA: Diagnosis not present

## 2020-03-25 DIAGNOSIS — B86 Scabies: Secondary | ICD-10-CM | POA: Diagnosis not present

## 2020-03-25 DIAGNOSIS — M545 Low back pain: Secondary | ICD-10-CM | POA: Diagnosis not present

## 2020-03-25 DIAGNOSIS — M62830 Muscle spasm of back: Secondary | ICD-10-CM | POA: Diagnosis not present

## 2020-04-27 DIAGNOSIS — D485 Neoplasm of uncertain behavior of skin: Secondary | ICD-10-CM | POA: Diagnosis not present

## 2020-04-27 DIAGNOSIS — B354 Tinea corporis: Secondary | ICD-10-CM | POA: Diagnosis not present

## 2020-04-27 DIAGNOSIS — L239 Allergic contact dermatitis, unspecified cause: Secondary | ICD-10-CM | POA: Diagnosis not present

## 2020-05-07 DIAGNOSIS — K219 Gastro-esophageal reflux disease without esophagitis: Secondary | ICD-10-CM | POA: Diagnosis not present

## 2020-05-07 DIAGNOSIS — Z125 Encounter for screening for malignant neoplasm of prostate: Secondary | ICD-10-CM | POA: Diagnosis not present

## 2020-05-07 DIAGNOSIS — Z0000 Encounter for general adult medical examination without abnormal findings: Secondary | ICD-10-CM | POA: Diagnosis not present

## 2020-05-07 DIAGNOSIS — I10 Essential (primary) hypertension: Secondary | ICD-10-CM | POA: Diagnosis not present

## 2020-05-07 DIAGNOSIS — E039 Hypothyroidism, unspecified: Secondary | ICD-10-CM | POA: Diagnosis not present

## 2020-05-07 DIAGNOSIS — E782 Mixed hyperlipidemia: Secondary | ICD-10-CM | POA: Diagnosis not present

## 2020-05-18 DIAGNOSIS — B354 Tinea corporis: Secondary | ICD-10-CM | POA: Diagnosis not present

## 2020-06-22 DIAGNOSIS — B354 Tinea corporis: Secondary | ICD-10-CM | POA: Diagnosis not present

## 2020-06-22 DIAGNOSIS — L810 Postinflammatory hyperpigmentation: Secondary | ICD-10-CM | POA: Diagnosis not present

## 2020-07-30 DIAGNOSIS — E039 Hypothyroidism, unspecified: Secondary | ICD-10-CM | POA: Diagnosis not present

## 2020-07-30 DIAGNOSIS — R6882 Decreased libido: Secondary | ICD-10-CM | POA: Diagnosis not present

## 2020-08-17 DIAGNOSIS — J4531 Mild persistent asthma with (acute) exacerbation: Secondary | ICD-10-CM | POA: Diagnosis not present

## 2020-08-27 DIAGNOSIS — Z8709 Personal history of other diseases of the respiratory system: Secondary | ICD-10-CM | POA: Diagnosis not present

## 2020-08-27 DIAGNOSIS — J209 Acute bronchitis, unspecified: Secondary | ICD-10-CM | POA: Diagnosis not present

## 2020-08-27 DIAGNOSIS — Z20822 Contact with and (suspected) exposure to covid-19: Secondary | ICD-10-CM | POA: Diagnosis not present

## 2020-10-22 DIAGNOSIS — U071 COVID-19: Secondary | ICD-10-CM | POA: Diagnosis not present

## 2020-11-12 DIAGNOSIS — K219 Gastro-esophageal reflux disease without esophagitis: Secondary | ICD-10-CM | POA: Diagnosis not present

## 2020-11-12 DIAGNOSIS — I10 Essential (primary) hypertension: Secondary | ICD-10-CM | POA: Diagnosis not present

## 2020-11-12 DIAGNOSIS — E782 Mixed hyperlipidemia: Secondary | ICD-10-CM | POA: Diagnosis not present

## 2020-11-12 DIAGNOSIS — E039 Hypothyroidism, unspecified: Secondary | ICD-10-CM | POA: Diagnosis not present

## 2021-05-13 DIAGNOSIS — I10 Essential (primary) hypertension: Secondary | ICD-10-CM | POA: Diagnosis not present

## 2021-05-13 DIAGNOSIS — E782 Mixed hyperlipidemia: Secondary | ICD-10-CM | POA: Diagnosis not present

## 2021-05-13 DIAGNOSIS — K219 Gastro-esophageal reflux disease without esophagitis: Secondary | ICD-10-CM | POA: Diagnosis not present

## 2021-05-13 DIAGNOSIS — E039 Hypothyroidism, unspecified: Secondary | ICD-10-CM | POA: Diagnosis not present

## 2021-05-13 DIAGNOSIS — Z0000 Encounter for general adult medical examination without abnormal findings: Secondary | ICD-10-CM | POA: Diagnosis not present

## 2021-05-13 DIAGNOSIS — Z125 Encounter for screening for malignant neoplasm of prostate: Secondary | ICD-10-CM | POA: Diagnosis not present

## 2021-09-13 DIAGNOSIS — J40 Bronchitis, not specified as acute or chronic: Secondary | ICD-10-CM | POA: Diagnosis not present

## 2021-09-13 DIAGNOSIS — R059 Cough, unspecified: Secondary | ICD-10-CM | POA: Diagnosis not present

## 2021-10-12 DIAGNOSIS — J0100 Acute maxillary sinusitis, unspecified: Secondary | ICD-10-CM | POA: Diagnosis not present

## 2022-01-20 DIAGNOSIS — E782 Mixed hyperlipidemia: Secondary | ICD-10-CM | POA: Diagnosis not present

## 2022-01-20 DIAGNOSIS — E039 Hypothyroidism, unspecified: Secondary | ICD-10-CM | POA: Diagnosis not present

## 2022-01-20 DIAGNOSIS — K219 Gastro-esophageal reflux disease without esophagitis: Secondary | ICD-10-CM | POA: Diagnosis not present

## 2022-01-20 DIAGNOSIS — I10 Essential (primary) hypertension: Secondary | ICD-10-CM | POA: Diagnosis not present

## 2022-02-04 ENCOUNTER — Other Ambulatory Visit: Payer: Self-pay | Admitting: Family Medicine

## 2022-02-04 DIAGNOSIS — M5416 Radiculopathy, lumbar region: Secondary | ICD-10-CM

## 2022-02-07 DIAGNOSIS — R6882 Decreased libido: Secondary | ICD-10-CM | POA: Diagnosis not present

## 2022-02-11 ENCOUNTER — Ambulatory Visit
Admission: RE | Admit: 2022-02-11 | Discharge: 2022-02-11 | Disposition: A | Discharge status: Home / Self Care | Payer: BC Managed Care – PPO | Source: Ambulatory Visit | Attending: Family Medicine | Admitting: Family Medicine

## 2022-02-11 DIAGNOSIS — R200 Anesthesia of skin: Secondary | ICD-10-CM | POA: Diagnosis not present

## 2022-02-11 DIAGNOSIS — R29898 Other symptoms and signs involving the musculoskeletal system: Secondary | ICD-10-CM | POA: Diagnosis not present

## 2022-02-11 DIAGNOSIS — M5416 Radiculopathy, lumbar region: Secondary | ICD-10-CM

## 2022-02-11 DIAGNOSIS — M545 Low back pain, unspecified: Secondary | ICD-10-CM | POA: Diagnosis not present

## 2022-02-11 DIAGNOSIS — M47816 Spondylosis without myelopathy or radiculopathy, lumbar region: Secondary | ICD-10-CM | POA: Diagnosis not present

## 2022-02-13 ENCOUNTER — Other Ambulatory Visit: Payer: Self-pay

## 2022-02-22 ENCOUNTER — Encounter: Payer: Self-pay | Admitting: Physical Medicine & Rehabilitation

## 2022-03-02 DIAGNOSIS — E291 Testicular hypofunction: Secondary | ICD-10-CM | POA: Diagnosis not present

## 2022-03-02 DIAGNOSIS — M5416 Radiculopathy, lumbar region: Secondary | ICD-10-CM | POA: Diagnosis not present

## 2022-03-02 DIAGNOSIS — M722 Plantar fascial fibromatosis: Secondary | ICD-10-CM | POA: Diagnosis not present

## 2022-03-03 DIAGNOSIS — G4733 Obstructive sleep apnea (adult) (pediatric): Secondary | ICD-10-CM | POA: Diagnosis not present

## 2022-03-10 ENCOUNTER — Ambulatory Visit (INDEPENDENT_AMBULATORY_CARE_PROVIDER_SITE_OTHER): Payer: BC Managed Care – PPO

## 2022-03-10 ENCOUNTER — Encounter: Payer: Self-pay | Admitting: Podiatry

## 2022-03-10 ENCOUNTER — Ambulatory Visit: Payer: BC Managed Care – PPO | Admitting: Podiatry

## 2022-03-10 DIAGNOSIS — M7732 Calcaneal spur, left foot: Secondary | ICD-10-CM | POA: Diagnosis not present

## 2022-03-10 DIAGNOSIS — M19071 Primary osteoarthritis, right ankle and foot: Secondary | ICD-10-CM | POA: Diagnosis not present

## 2022-03-10 DIAGNOSIS — M7661 Achilles tendinitis, right leg: Secondary | ICD-10-CM | POA: Diagnosis not present

## 2022-03-10 DIAGNOSIS — M25775 Osteophyte, left foot: Secondary | ICD-10-CM | POA: Diagnosis not present

## 2022-03-10 DIAGNOSIS — M79671 Pain in right foot: Secondary | ICD-10-CM

## 2022-03-10 DIAGNOSIS — M722 Plantar fascial fibromatosis: Secondary | ICD-10-CM | POA: Diagnosis not present

## 2022-03-10 DIAGNOSIS — M7662 Achilles tendinitis, left leg: Secondary | ICD-10-CM

## 2022-03-10 DIAGNOSIS — M7731 Calcaneal spur, right foot: Secondary | ICD-10-CM | POA: Diagnosis not present

## 2022-03-10 MED ORDER — DEXAMETHASONE SODIUM PHOSPHATE 120 MG/30ML IJ SOLN: 4.0000 mg | Freq: Once | INTRAMUSCULAR | Status: AC

## 2022-03-10 MED ORDER — MELOXICAM 15 MG PO TABS: 15.0000 mg | ORAL_TABLET | Freq: Every day | ORAL | 0 refills | Status: DC

## 2022-03-10 NOTE — Progress Notes (Signed)
  Subjective:  Patient ID: Mike Shaw, male    DOB: 1971/01/02,   MRN: [id number]  Chief Complaint  Patient presents with   Plantar Fasciitis    I have some heel pain on both feet and they get sore and tender and have a pulling feeling to them and feels like razor blades and I have some back issues and hurts all the time and the right is the worst    51 y.o. male presents for concern of bilateral heel pain that has been going on for several months. Relates soreness and burning and a "razor blade feeling" States he has had back issues in the base. Relates ankles have been swollen. Denies any treatments. Relates right is worse. Denies diabetes.  . Denies any other pedal complaints. Denies n/v/f/c.   Past Medical History:  Diagnosis Date   Asthma    Hyperlipidemia    Hypertension    OSA (obstructive sleep apnea)     Objective:  Physical Exam: Vascular: DP/PT pulses 2/4 bilateral. CFT <3 seconds. Normal hair growth on digits. No edema.  Skin. No lacerations or abrasions bilateral feet.  Musculoskeletal: MMT 5/5 bilateral lower extremities in DF, PF, Inversion and Eversion. Deceased ROM in DF of ankle joint. Tender to medial calcaneal tubercle bilateral. Tender to achilles insertion bilateral more so on the right. Pain with DF of bilateral feet. No pain along PT or in arch. No pain with calcaneal squeeze.  Neurological: Sensation intact to light touch.   Assessment:   1. Plantar fasciitis, bilateral   2. Achilles tendonitis, bilateral      Plan:  Patient was evaluated and treated and all questions answered. Discussed plantar fasciitis and achilles tendonitis  with patient.  X-rays reviewed and discussed with patient. No acute fractures or dislocations noted. Spurring noted at inferior calcaneus and posterior bilateral Discussed treatment options including, ice, NSAIDS, supportive shoes, bracing, and stretching. Stretching exercises provided to be done on a daily basis.    Prescription for meloxicam provided and sent to pharmacy. Patient requesting injection today. Procedure note below.   Heel lifts provided for right achilles  Will be evaluated by back specialist soon.  Follow-up 6 weeks or sooner if any problems arise. In the meantime, encouraged to call the office with any questions, concerns, change in symptoms.   Procedure:  Discussed etiology, pathology, conservative vs. surgical therapies. At this time a plantar fascial injection was recommended.  The patient agreed and a sterile skin prep was applied.  An injection consisting of  dexamethasone and marcaine mixture was infiltrated at the point of maximal tenderness on the bilateral Heel.  Bandaid applied. The patient tolerated this well and was given instructions for aftercare.     Louann Sjogren, DPM

## 2022-03-10 NOTE — Patient Instructions (Addendum)
Plantar Fasciitis (Heel Spur Syndrome) with Rehab The plantar fascia is a fibrous, ligament-like, soft-tissue structure that spans the bottom of the foot. Plantar fasciitis is a condition that causes pain in the foot due to inflammation of the tissue. SYMPTOMS   Pain and tenderness on the underneath side of the foot.  Pain that worsens with standing or walking. CAUSES  Plantar fasciitis is caused by irritation and injury to the plantar fascia on the underneath side of the foot. Common mechanisms of injury include:  Direct trauma to bottom of the foot.  Damage to a small nerve that runs under the foot where the main fascia attaches to the heel bone.  Stress placed on the plantar fascia due to bone spurs. RISK INCREASES WITH:   Activities that place stress on the plantar fascia (running, jumping, pivoting, or cutting).  Poor strength and flexibility.  Improperly fitted shoes.  Tight calf muscles.  Flat feet.  Failure to warm-up properly before activity.  Obesity. PREVENTION  Warm up and stretch properly before activity.  Allow for adequate recovery between workouts.  Maintain physical fitness:  Strength, flexibility, and endurance.  Cardiovascular fitness.  Maintain a health body weight.  Avoid stress on the plantar fascia.  Wear properly fitted shoes, including arch supports for individuals who have flat feet.  PROGNOSIS  If treated properly, then the symptoms of plantar fasciitis usually resolve without surgery. However, occasionally surgery is necessary.  RELATED COMPLICATIONS   Recurrent symptoms that may result in a chronic condition.  Problems of the lower back that are caused by compensating for the injury, such as limping.  Pain or weakness of the foot during push-off following surgery.  Chronic inflammation, scarring, and partial or complete fascia tear, occurring more often from repeated injections.  TREATMENT  Treatment initially involves the  use of ice and medication to help reduce pain and inflammation. The use of strengthening and stretching exercises may help reduce pain with activity, especially stretches of the Achilles tendon. These exercises may be performed at home or with a therapist. Your caregiver may recommend that you use heel cups of arch supports to help reduce stress on the plantar fascia. Occasionally, corticosteroid injections are given to reduce inflammation. If symptoms persist for greater than 6 months despite non-surgical (conservative), then surgery may be recommended.   MEDICATION   If pain medication is necessary, then nonsteroidal anti-inflammatory medications, such as aspirin and ibuprofen, or other minor pain relievers, such as acetaminophen, are often recommended.  Do not take pain medication within 7 days before surgery.  Prescription pain relievers may be given if deemed necessary by your caregiver. Use only as directed and only as much as you need.  Corticosteroid injections may be given by your caregiver. These injections should be reserved for the most serious cases, because they may only be given a certain number of times.  HEAT AND COLD  Cold treatment (icing) relieves pain and reduces inflammation. Cold treatment should be applied for 10 to 15 minutes every 2 to 3 hours for inflammation and pain and immediately after any activity that aggravates your symptoms. Use ice packs or massage the area with a piece of ice (ice massage).  Heat treatment may be used prior to performing the stretching and strengthening activities prescribed by your caregiver, physical therapist, or athletic trainer. Use a heat pack or soak the injury in warm water.  SEEK IMMEDIATE MEDICAL CARE IF:  Treatment seems to offer no benefit, or the condition worsens.  Any medications   produce adverse side effects.  EXERCISES- RANGE OF MOTION (ROM) AND STRETCHING EXERCISES - Plantar Fasciitis (Heel Spur Syndrome) These exercises  may help you when beginning to rehabilitate your injury. Your symptoms may resolve with or without further involvement from your physician, physical therapist or athletic trainer. While completing these exercises, remember:   Restoring tissue flexibility helps normal motion to return to the joints. This allows healthier, less painful movement and activity.  An effective stretch should be held for at least 30 seconds.  A stretch should never be painful. You should only feel a gentle lengthening or release in the stretched tissue.  RANGE OF MOTION - Toe Extension, Flexion  Sit with your right / left leg crossed over your opposite knee.  Grasp your toes and gently pull them back toward the top of your foot. You should feel a stretch on the bottom of your toes and/or foot.  Hold this stretch for 10 seconds.  Now, gently pull your toes toward the bottom of your foot. You should feel a stretch on the top of your toes and or foot.  Hold this stretch for 10 seconds. Repeat  times. Complete this stretch 3 times per day.   RANGE OF MOTION - Ankle Dorsiflexion, Active Assisted  Remove shoes and sit on a chair that is preferably not on a carpeted surface.  Place right / left foot under knee. Extend your opposite leg for support.  Keeping your heel down, slide your right / left foot back toward the chair until you feel a stretch at your ankle or calf. If you do not feel a stretch, slide your bottom forward to the edge of the chair, while still keeping your heel down.  Hold this stretch for 10 seconds. Repeat 3 times. Complete this stretch 2 times per day.   STRETCH  Gastroc, Standing  Place hands on wall.  Extend right / left leg, keeping the front knee somewhat bent.  Slightly point your toes inward on your back foot.  Keeping your right / left heel on the floor and your knee straight, shift your weight toward the wall, not allowing your back to arch.  You should feel a gentle stretch  in the right / left calf. Hold this position for 10 seconds. Repeat 3 times. Complete this stretch 2 times per day.  STRETCH  Soleus, Standing  Place hands on wall.  Extend right / left leg, keeping the other knee somewhat bent.  Slightly point your toes inward on your back foot.  Keep your right / left heel on the floor, bend your back knee, and slightly shift your weight over the back leg so that you feel a gentle stretch deep in your back calf.  Hold this position for 10 seconds. Repeat 3 times. Complete this stretch 2 times per day.  STRETCH  Gastrocsoleus, Standing  Note: This exercise can place a lot of stress on your foot and ankle. Please complete this exercise only if specifically instructed by your caregiver.   Place the ball of your right / left foot on a step, keeping your other foot firmly on the same step.  Hold on to the wall or a rail for balance.  Slowly lift your other foot, allowing your body weight to press your heel down over the edge of the step.  You should feel a stretch in your right / left calf.  Hold this position for 10 seconds.  Repeat this exercise with a slight bend in your right /   left knee. Repeat 3 times. Complete this stretch 2 times per day.   STRENGTHENING EXERCISES - Plantar Fasciitis (Heel Spur Syndrome)  These exercises may help you when beginning to rehabilitate your injury. They may resolve your symptoms with or without further involvement from your physician, physical therapist or athletic trainer. While completing these exercises, remember:   Muscles can gain both the endurance and the strength needed for everyday activities through controlled exercises.  Complete these exercises as instructed by your physician, physical therapist or athletic trainer. Progress the resistance and repetitions only as guided.  STRENGTH - Towel Curls  Sit in a chair positioned on a non-carpeted surface.  Place your foot on a towel, keeping your heel  on the floor.  Pull the towel toward your heel by only curling your toes. Keep your heel on the floor. Repeat 3 times. Complete this exercise 2 times per day.  STRENGTH - Ankle Inversion  Secure one end of a rubber exercise band/tubing to a fixed object (table, pole). Loop the other end around your foot just before your toes.  Place your fists between your knees. This will focus your strengthening at your ankle.  Slowly, pull your big toe up and in, making sure the band/tubing is positioned to resist the entire motion.  Hold this position for 10 seconds.  Have your muscles resist the band/tubing as it slowly pulls your foot back to the starting position. Repeat 3 times. Complete this exercises 2 times per day.  Document Released: 09/19/2005 Document Revised: 12/12/2011 Document Reviewed: 01/01/2009 [employer] Patient Information 2014 [employer]. Achilles Tendinitis  with Rehab Achilles tendinitis is a disorder of the Achilles tendon. The Achilles tendon connects the large calf muscles (Gastrocnemius and Soleus) to the heel bone (calcaneus). This tendon is sometimes called the heel cord. It is important for pushing-off and standing on your toes and is important for walking, running, or jumping. Tendinitis is often caused by overuse and repetitive microtrauma. SYMPTOMS  Pain, tenderness, swelling, warmth, and redness may occur over the Achilles tendon even at rest.  Pain with pushing off, or flexing or extending the ankle.  Pain that is worsened after or during activity. CAUSES   Overuse sometimes seen with rapid increase in exercise programs or in sports requiring running and jumping.  Poor physical conditioning (strength and flexibility or endurance).  Running sports, especially training running down hills.  Inadequate warm-up before practice or play or failure to stretch before participation.  Injury to the tendon. PREVENTION   Warm up and stretch before practice or  competition.  Allow time for adequate rest and recovery between practices and competition.  Keep up conditioning.  Keep up ankle and leg flexibility.  Improve or keep muscle strength and endurance.  Improve cardiovascular fitness.  Use proper technique.  Use proper equipment (shoes, skates).  To help prevent recurrence, taping, protective strapping, or an adhesive bandage may be recommended for several weeks after healing is complete. PROGNOSIS   Recovery may take weeks to several months to heal.  Longer recovery is expected if symptoms have been prolonged.  Recovery is usually quicker if the inflammation is due to a direct blow as compared with overuse or sudden strain. RELATED COMPLICATIONS   Healing time will be prolonged if the condition is not correctly treated. The injury must be given plenty of time to heal.  Symptoms can reoccur if activity is resumed too soon.  Untreated, tendinitis may increase the risk of tendon rupture requiring additional time for recovery   and possibly surgery. TREATMENT   The first treatment consists of rest anti-inflammatory medication, and ice to relieve the pain.  Stretching and strengthening exercises after resolution of pain will likely help reduce the risk of recurrence. Referral to a physical therapist or athletic trainer for further evaluation and treatment may be helpful.  A walking boot or cast may be recommended to rest the Achilles tendon. This can help break the cycle of inflammation and microtrauma.  Arch supports (orthotics) may be prescribed or recommended by your caregiver as an adjunct to therapy and rest.  Surgery to remove the inflamed tendon lining or degenerated tendon tissue is rarely necessary and has shown less than predictable results. MEDICATION   Nonsteroidal anti-inflammatory medications, such as aspirin and ibuprofen, may be used for pain and inflammation relief. Do not take within 7 days before surgery. Take  these as directed by your caregiver. Contact your caregiver immediately if any bleeding, stomach upset, or signs of allergic reaction occur. Other minor pain relievers, such as acetaminophen, may also be used.  Pain relievers may be prescribed as necessary by your caregiver. Do not take prescription pain medication for longer than 4 to 7 days. Use only as directed and only as much as you need.  Cortisone injections are rarely indicated. Cortisone injections may weaken tendons and predispose to rupture. It is better to give the condition more time to heal than to use them. HEAT AND COLD  Cold is used to relieve pain and reduce inflammation for acute and chronic Achilles tendinitis. Cold should be applied for 10 to 15 minutes every 2 to 3 hours for inflammation and pain and immediately after any activity that aggravates your symptoms. Use ice packs or an ice massage.  Heat may be used before performing stretching and strengthening activities prescribed by your caregiver. Use a heat pack or a warm soak. SEEK MEDICAL CARE IF:  Symptoms get worse or do not improve in 2 weeks despite treatment.  New, unexplained symptoms develop. Drugs used in treatment may produce side effects.  EXERCISES:  RANGE OF MOTION (ROM) AND STRETCHING EXERCISES - Achilles Tendinitis  These exercises may help you when beginning to rehabilitate your injury. Your symptoms may resolve with or without further involvement from your physician, physical therapist or athletic trainer. While completing these exercises, remember:   Restoring tissue flexibility helps normal motion to return to the joints. This allows healthier, less painful movement and activity.  An effective stretch should be held for at least 30 seconds.  A stretch should never be painful. You should only feel a gentle lengthening or release in the stretched tissue.  STRETCH  Gastroc, Standing   Place hands on wall.  Extend right / left leg, keeping the  front knee somewhat bent.  Slightly point your toes inward on your back foot.  Keeping your right / left heel on the floor and your knee straight, shift your weight toward the wall, not allowing your back to arch.  You should feel a gentle stretch in the right / left calf. Hold this position for 10 seconds. Repeat 3 times. Complete this stretch 2 times per day.  STRETCH  Soleus, Standing   Place hands on wall.  Extend right / left leg, keeping the other knee somewhat bent.  Slightly point your toes inward on your back foot.  Keep your right / left heel on the floor, bend your back knee, and slightly shift your weight over the back leg so that you feel a   gentle stretch deep in your back calf.  Hold this position for 10 seconds. Repeat 3 times. Complete this stretch 2 times per day.  STRETCH  Gastrocsoleus, Standing  Note: This exercise can place a lot of stress on your foot and ankle. Please complete this exercise only if specifically instructed by your caregiver.   Place the ball of your right / left foot on a step, keeping your other foot firmly on the same step.  Hold on to the wall or a rail for balance.  Slowly lift your other foot, allowing your body weight to press your heel down over the edge of the step.  You should feel a stretch in your right / left calf.  Hold this position for 10 seconds.  Repeat this exercise with a slight bend in your knee. Repeat 3 times. Complete this stretch 2 times per day.   STRENGTHENING EXERCISES - Achilles Tendinitis These exercises may help you when beginning to rehabilitate your injury. They may resolve your symptoms with or without further involvement from your physician, physical therapist or athletic trainer. While completing these exercises, remember:   Muscles can gain both the endurance and the strength needed for everyday activities through controlled exercises.  Complete these exercises as instructed by your physician,  physical therapist or athletic trainer. Progress the resistance and repetitions only as guided.  You may experience muscle soreness or fatigue, but the pain or discomfort you are trying to eliminate should never worsen during these exercises. If this pain does worsen, stop and make certain you are following the directions exactly. If the pain is still present after adjustments, discontinue the exercise until you can discuss the trouble with your clinician.  STRENGTH - Plantar-flexors   Sit with your right / left leg extended. Holding onto both ends of a rubber exercise band/tubing, loop it around the ball of your foot. Keep a slight tension in the band.  Slowly push your toes away from you, pointing them downward.  Hold this position for 10 seconds. Return slowly, controlling the tension in the band/tubing. Repeat 3 times. Complete this exercise 2 times per day.   STRENGTH - Plantar-flexors   Stand with your feet shoulder width apart. Steady yourself with a wall or table using as little support as needed.  Keeping your weight evenly spread over the width of your feet, rise up on your toes.*  Hold this position for 10 seconds. Repeat 3 times. Complete this exercise 2 times per day.  *If this is too easy, shift your weight toward your right / left leg until you feel challenged. Ultimately, you may be asked to do this exercise with your right / left foot only.  STRENGTH  Plantar-flexors, Eccentric  Note: This exercise can place a lot of stress on your foot and ankle. Please complete this exercise only if specifically instructed by your caregiver.   Place the balls of your feet on a step. With your hands, use only enough support from a wall or rail to keep your balance.  Keep your knees straight and rise up on your toes.  Slowly shift your weight entirely to your right / left toes and pick up your opposite foot. Gently and with controlled movement, lower your weight through your right /  left foot so that your heel drops below the level of the step. You will feel a slight stretch in the back of your calf at the end position.  Use the healthy leg to help rise up onto   the balls of both feet, then lower weight only on the right / left leg again. Build up to 15 repetitions. Then progress to 3 consecutive sets of 15 repetitions.*  After completing the above exercise, complete the same exercise with a slight knee bend (about 30 degrees). Again, build up to 15 repetitions. Then progress to 3 consecutive sets of 15 repetitions.* Perform this exercise 2 times per day.  *When you easily complete 3 sets of 15, your physician, physical therapist or athletic trainer may advise you to add resistance by wearing a backpack filled with additional weight.  STRENGTH - Plantar Flexors, Seated   Sit on a chair that allows your feet to rest flat on the ground. If necessary, sit at the edge of the chair.  Keeping your toes firmly on the ground, lift your right / left heel as far as you can without increasing any discomfort in your ankle. Repeat 3 times. Complete this exercise 2 times a day.  

## 2022-03-21 DIAGNOSIS — E291 Testicular hypofunction: Secondary | ICD-10-CM | POA: Diagnosis not present

## 2022-04-01 ENCOUNTER — Encounter
Payer: BC Managed Care – PPO | Attending: Physical Medicine & Rehabilitation | Admitting: Physical Medicine & Rehabilitation

## 2022-04-01 ENCOUNTER — Encounter: Payer: Self-pay | Admitting: Physical Medicine & Rehabilitation

## 2022-04-01 VITALS — BP 129/81 | HR 81 | Ht 77.0 in | Wt 338.0 lb

## 2022-04-01 DIAGNOSIS — F0630 Mood disorder due to known physiological condition, unspecified: Secondary | ICD-10-CM | POA: Diagnosis not present

## 2022-04-01 DIAGNOSIS — M545 Low back pain, unspecified: Secondary | ICD-10-CM | POA: Diagnosis not present

## 2022-04-01 DIAGNOSIS — M722 Plantar fascial fibromatosis: Secondary | ICD-10-CM | POA: Insufficient documentation

## 2022-04-01 DIAGNOSIS — G5712 Meralgia paresthetica, left lower limb: Secondary | ICD-10-CM | POA: Insufficient documentation

## 2022-04-01 DIAGNOSIS — G8929 Other chronic pain: Secondary | ICD-10-CM | POA: Insufficient documentation

## 2022-04-01 MED ORDER — CYCLOBENZAPRINE HCL 5 MG PO TABS: 5.0000 mg | ORAL_TABLET | Freq: Three times a day (TID) | ORAL | 1 refills | Status: DC | PRN

## 2022-04-01 MED ORDER — DULOXETINE HCL 30 MG PO CPEP: 30.0000 mg | ORAL_CAPSULE | Freq: Every day | ORAL | 3 refills | Status: DC

## 2022-04-01 MED ORDER — CYCLOBENZAPRINE HCL 5 MG PO TABS
5.0000 mg | ORAL_TABLET | Freq: Three times a day (TID) | ORAL | 1 refills | Status: DC | PRN
Start: 2022-04-01 — End: 2022-07-04

## 2022-04-01 MED ORDER — DULOXETINE HCL 30 MG PO CPEP: 30.0000 mg | ORAL_CAPSULE | Freq: Every day | ORAL | 2 refills | Status: DC

## 2022-04-01 NOTE — Progress Notes (Unsigned)
Subjective:    Patient ID: Mike Shaw, male    DOB: [birth year], 51 y.o.   MRN: [id number]  HPI MR Shurley is a very pleasant 51 year old male with past medical history of plantar fasciitis, asthma Hypertension, OSA who is here for chronic lower back pain.  Years however it has been worsening over the last 5 to 6 years.  Pain is primarily on the right side of his lower back and is worsened by activities.  Activities, bending twisting, walking will worsen his pain.  Sometimes he has shooting electric type pains that travel down his back.  He also has some numbness over his left thigh in the distribution of the lateral femoral cutaneous nerve.  He has been seen in the past for leg length discrepancy with shorter leg on the right.  He has had this for many years and previously wore inserts in his shoes.  A few years ago orthopedics ordered for him to have shoe modifications because his leg length discrepancy was about an inch.  He never finished the process to get the shoe adjustment.  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  He has tried meloxicam for this pain without much improvement.  Patient reports his mood is sometimes decreased no SI or HI.  He has taken Cymbalta in the past for decreased mood without any side effects.  Pain Inventory Average Pain 9 Pain Right Now 7 My pain is sharp, burning, dull, stabbing, tingling, and aching  In the last 24 hours, has pain interfered with the following? General activity 8 Relation with others 7 Enjoyment of life 8 What TIME of day is your pain at its worst? varies Sleep (in general) Fair  Pain is worse with: walking, bending, sitting, inactivity, and standing Pain improves with: rest, heat/ice, and medication Relief from Meds: 3  use a cane ability to climb steps?  yes do you drive?  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   yes  weakness numbness tingling trouble walking spasms depression anxiety  Any changes since last visit?  no  Any changes since last visit?  no    Family History  Problem Relation Age of Onset   Asthma Father    Emphysema Father        was a smoker   Prostate cancer Father    Lung disease Paternal Aunt    Lung disease Paternal Uncle    Heart disease Paternal Grandfather    Social History   Socioeconomic History   Marital status: Divorced    Spouse name: Not on file   Number of children: 2   Years of education: Not on file   Highest education level: Not on file  Occupational History   Occupation: Publishing copy  Tobacco Use   Smoking status: Never   Smokeless tobacco: Never  Vaping Use   Vaping Use: Never used  Substance and Sexual Activity   Alcohol use: No   Drug use: No   Sexual activity: Not on file  Other Topics Concern   Not on file  Social History Narrative   Not on file   Social Determinants of Health   Financial Resource Strain: Not on file  Food Insecurity: Not on file  Transportation Needs: Not on file  Physical Activity: Not on file  Stress: Not on file  Social Connections: Not on file  Past Surgical History:  Procedure Laterality Date   NASAL SINUS SURGERY  2000   Past Medical History:  Diagnosis Date   Asthma    Hyperlipidemia    Hypertension    OSA (obstructive sleep apnea)    BP 129/81   Pulse 81   Ht 6\' 5"  (1.956 m)   Wt (!) 338 lb (153.3 kg)   SpO2 98%   BMI 40.08 kg/m   Opioid Risk Score:   Fall Risk Score:  `1  Depression screen [facility] 2/9     04/01/2022    9:52 AM  Depression screen PHQ 2/9  Decreased Interest 3  Down, Depressed, Hopeless 1  PHQ - 2 Score 4  Altered sleeping 1  Tired, decreased energy 1  Change in appetite 2  Feeling bad or failure about yourself  1  Trouble concentrating 3  Moving slowly or fidgety/restless 0  Suicidal thoughts 0  PHQ-9 Score 12  Difficult doing work/chores Very difficult      Review of Systems  Constitutional:  Positive for appetite change and unexpected weight change.  HENT: Negative.    Eyes: Negative.   Respiratory:  Positive for apnea, shortness of breath and wheezing.   Cardiovascular:  Positive for leg swelling.  Gastrointestinal: Negative.   Endocrine: Negative.   Genitourinary: Negative.   Musculoskeletal:  Positive for back pain and gait problem.  Skin: Negative.   Allergic/Immunologic: Negative.   Neurological:  Positive for weakness and numbness.  Psychiatric/Behavioral:  Positive for dysphoric mood. The patient is nervous/anxious.       Objective:   Physical Exam Gen: no distress, normal appearing HEENT: oral mucosa pink and moist, NCAT Cardio: Reg rate Chest: normal effort, normal rate of breathing Abd: soft, non-distended,obese Ext: no edema Psych: pleasant, normal affect Skin: intact Neuro: Alert and oriented x1 sensation intact in all 4 extremities Altered sensation noted over distribution of left lateral femoral cutaneous nerve  Right leg appears to be about 2 cm shorter than the left Hips appear asymmetric when he is walking musculoskeletal:  Lumbar paraspinal tenderness and tenderness over the QL on the right . SLR, fair, FABER bilaterally were negative however they did result in some lower back paraspinal pain Tenderness over the plantar fascia  02/14/2022 Segmentation: 5 non rib-bearing lumbar type vertebral bodies are present. The lowest fully formed vertebral body is L5.   Alignment:  No significant listhesis is present.   Vertebrae: Edematous endplate marrow changes are noted in the anterior inferior aspect of T12. Marrow signal and vertebral body heights are otherwise normal.   Conus medullaris and cauda equina: Conus extends to the T12 level. Conus and cauda equina appear normal.   Paraspinal and other soft tissues: Limited imaging the abdomen is unremarkable. There is no significant adenopathy. No solid  organ lesions are present.   Disc levels:   L1-2: A broad-based disc protrusion has progressed. Mild foraminal narrowing is worse left than right. The central canal is patent.   L2-3: Mild disc bulging and facet hypertrophy is present without significant stenosis.   L3-4: Progressive moderate facet hypertrophy is worse on the right. Mild broad-based disc protrusion is present. Mild bilateral foraminal narrowing is present.   L4-5: A leftward disc protrusion is present. Moderate facet hypertrophy is noted. This results in mild left subarticular and moderate left foraminal stenosis. Far left annular tear is noted.   L5-S1: Negative.   IMPRESSION: 1. Progressive multilevel spondylosis of the lumbar spine as described. 2. Mild foraminal narrowing bilaterally  at L1-2 is worse on the left. 3. Mild bilateral foraminal narrowing at L3-4 is worse on the right. 4. Mild left subarticular and moderate left foraminal stenosis at L4-5 with a far left annular tear.      Assessment & Plan:  Lower back pain chronic without sciatica, multilevel lumbar spondylosis -I suspect the cause of his worsening lumbar back pain is his related to altered gait due to his leg length discrepancy, he was provided with a prescription for [employer] for shoe modifications, advised him to wear this all the time when he is ambulating -We will start duloxetine 30 mg daily for pain, I also think it might help his mood, he has tolerated well in the past -Flexeril as needed ordered for muscle spasms -If pain does not improve can consider trigger point injections  Mood disorder -Duloxetine may provide benefit for this as well  Plantar fasciitis bilateral -[employer] for orthotics to help with this as well -Advised Achilles tendon stretching  Lateral femoral cutaneous neuropathy -Suspect this is the cause of his left thigh pain, less likely numbness from L1-2 narrowing -Advised to try avoiding postures that increase  pressure at the origin of this nerve, avoid restrictive belts at his waist

## 2022-04-21 ENCOUNTER — Ambulatory Visit: Payer: BC Managed Care – PPO | Admitting: Podiatry

## 2022-05-13 ENCOUNTER — Ambulatory Visit: Payer: BC Managed Care – PPO | Admitting: Podiatry

## 2022-05-23 DIAGNOSIS — M722 Plantar fascial fibromatosis: Secondary | ICD-10-CM | POA: Diagnosis not present

## 2022-05-23 DIAGNOSIS — M21751 Unequal limb length (acquired), right femur: Secondary | ICD-10-CM | POA: Diagnosis not present

## 2022-05-27 ENCOUNTER — Ambulatory Visit: Payer: BC Managed Care – PPO | Admitting: Physical Medicine & Rehabilitation

## 2022-06-10 ENCOUNTER — Ambulatory Visit: Payer: BC Managed Care – PPO | Admitting: Podiatry

## 2022-06-30 DIAGNOSIS — E039 Hypothyroidism, unspecified: Secondary | ICD-10-CM | POA: Diagnosis not present

## 2022-06-30 DIAGNOSIS — Z125 Encounter for screening for malignant neoplasm of prostate: Secondary | ICD-10-CM | POA: Diagnosis not present

## 2022-06-30 DIAGNOSIS — Z0000 Encounter for general adult medical examination without abnormal findings: Secondary | ICD-10-CM | POA: Diagnosis not present

## 2022-06-30 DIAGNOSIS — K219 Gastro-esophageal reflux disease without esophagitis: Secondary | ICD-10-CM | POA: Diagnosis not present

## 2022-06-30 DIAGNOSIS — Z23 Encounter for immunization: Secondary | ICD-10-CM | POA: Diagnosis not present

## 2022-06-30 DIAGNOSIS — F411 Generalized anxiety disorder: Secondary | ICD-10-CM | POA: Diagnosis not present

## 2022-06-30 DIAGNOSIS — E291 Testicular hypofunction: Secondary | ICD-10-CM | POA: Diagnosis not present

## 2022-06-30 DIAGNOSIS — E782 Mixed hyperlipidemia: Secondary | ICD-10-CM | POA: Diagnosis not present

## 2022-07-01 ENCOUNTER — Ambulatory Visit: Payer: BC Managed Care – PPO | Admitting: Podiatry

## 2022-07-04 ENCOUNTER — Encounter: Payer: Self-pay | Admitting: Physical Medicine & Rehabilitation

## 2022-07-04 ENCOUNTER — Encounter
Payer: BC Managed Care – PPO | Attending: Physical Medicine & Rehabilitation | Admitting: Physical Medicine & Rehabilitation

## 2022-07-04 VITALS — BP 131/84 | HR 86 | Ht 77.0 in | Wt 341.6 lb

## 2022-07-04 DIAGNOSIS — M2170 Unequal limb length (acquired), unspecified site: Secondary | ICD-10-CM | POA: Insufficient documentation

## 2022-07-04 DIAGNOSIS — G8929 Other chronic pain: Secondary | ICD-10-CM | POA: Diagnosis not present

## 2022-07-04 DIAGNOSIS — M549 Dorsalgia, unspecified: Secondary | ICD-10-CM | POA: Diagnosis not present

## 2022-07-04 DIAGNOSIS — M722 Plantar fascial fibromatosis: Secondary | ICD-10-CM | POA: Insufficient documentation

## 2022-07-04 DIAGNOSIS — F39 Unspecified mood [affective] disorder: Secondary | ICD-10-CM | POA: Diagnosis not present

## 2022-07-04 MED ORDER — METHOCARBAMOL 500 MG PO TABS
1000.0000 mg | ORAL_TABLET | Freq: Three times a day (TID) | ORAL | 1 refills | Status: DC
Start: 2022-07-04 — End: 2022-08-15

## 2022-07-04 MED ORDER — METHOCARBAMOL 1000 MG PO TABS: 1000.0000 mg | ORAL_TABLET | Freq: Three times a day (TID) | ORAL | 0 refills | Status: DC | PRN

## 2022-07-04 NOTE — Progress Notes (Signed)
Subjective:    Patient ID: Mike Shaw, male    DOB: 10/27/1970, 51 y.o.   MRN: [id number]  HPI HPI 04/01/22 MR Fahs is a very pleasant 51 year old male with past medical history of plantar fasciitis, asthma Hypertension, OSA who is here for chronic lower back pain.  Years however it has been worsening over the last 5 to 6 years.  Pain is primarily on the right side of his lower back and is worsened by activities.  Activities, bending twisting, walking will worsen his pain.  Sometimes he has shooting electric type pains that travel down his back.  He also has some numbness over his left thigh in the distribution of the lateral femoral cutaneous nerve.  He has been seen in the past for leg length discrepancy with shorter leg on the right.  He has had this for many years and previously wore inserts in his shoes.  A few years ago orthopedics ordered for him to have shoe modifications because his leg length discrepancy was about an inch.  He never finished the process to get the shoe adjustment.  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  He has tried meloxicam for this pain without much improvement.  Patient reports his mood is sometimes decreased no SI or HI.  He has taken Cymbalta in the past for decreased mood without any side effects.  Interval History Mr. Bobrowski is here for follow-up of his chronic back pain and leg length discrepancy.  He says he has had his right total shoe for about a month has not had any improvement in his back pain and is having increased tightness in his hamstrings especially on the right and his right knee.  Patient reports orthotist tested him with various blocks under his shoe into his pelvis was level, and added just a little less than an inch to his right shoes.  He says he is wearing his shoes all the time when he is ambulating.  He does report his planter fasciitis is improved with shoe inserts.  Patient says he  tried the Cymbalta for about a week but discontinued it because he did not want to use the medication to mask the pain at the time.  Flexeril did not help his pain. He continues to have altered sensation in LLE in location of lateral femoral cutaneous. Mood is decreased related to pain. No SI or HI.   Pain Inventory Average Pain 8 Pain Right Now 6 My pain is constant, sharp, burning, dull, stabbing, and aching  In the last 24 hours, has pain interfered with the following? General activity 8 Relation with others 7 Enjoyment of life 7 What TIME of day is your pain at its worst? morning , daytime, and evening Sleep (in general) Fair  Pain is worse with: walking, bending, sitting, and some activites Pain improves with:  unsure Relief from Meds: 0  Family History  Problem Relation Age of Onset   Asthma Father    Emphysema Father        was a smoker   Prostate cancer Father    Lung disease Paternal Aunt    Lung disease Paternal Uncle    Heart disease Paternal Grandfather    Social History   Socioeconomic History   Marital status: Divorced    Spouse name: Not on file   Number of children: 2   Years of education: Not on file   Highest  education level: Not on file  Occupational History   Occupation: Demolition  Tobacco Use   Smoking status: Never   Smokeless tobacco: Never  Vaping Use   Vaping Use: Never used  Substance and Sexual Activity   Alcohol use: No   Drug use: No   Sexual activity: Not on file  Other Topics Concern   Not on file  Social History Narrative   Not on file   Social Determinants of Health   Financial Resource Strain: Not on file  Food Insecurity: Not on file  Transportation Needs: Not on file  Physical Activity: Not on file  Stress: Not on file  Social Connections: Not on file   Past Surgical History:  Procedure Laterality Date   NASAL SINUS SURGERY  2000   Past Surgical History:  Procedure Laterality Date   NASAL SINUS SURGERY  2000    Past Medical History:  Diagnosis Date   Asthma    Hyperlipidemia    Hypertension    OSA (obstructive sleep apnea)    BP 131/84   Pulse 86   Ht 6\' 5"  (1.956 m)   Wt (!) 341 lb 9.6 oz (154.9 kg)   SpO2 97%   BMI 40.51 kg/m   Opioid Risk Score:   Fall Risk Score:  `1  Depression screen [facility] 2/9     07/04/2022    1:08 PM 04/01/2022    9:52 AM  Depression screen PHQ 2/9  Decreased Interest 1 3  Down, Depressed, Hopeless 1 1  PHQ - 2 Score 2 4  Altered sleeping  1  Tired, decreased energy  1  Change in appetite  2  Feeling bad or failure about yourself   1  Trouble concentrating  3  Moving slowly or fidgety/restless  0  Suicidal thoughts  0  PHQ-9 Score  12  Difficult doing work/chores  Very difficult     Review of Systems  Constitutional: Negative.   HENT: Negative.    Eyes: Negative.   Respiratory: Negative.    Cardiovascular: Negative.   Gastrointestinal: Negative.   Endocrine: Negative.   Genitourinary: Negative.   Musculoskeletal:  Positive for back pain.  Skin: Negative.   Allergic/Immunologic: Negative.   Neurological: Negative.   Hematological: Negative.   Psychiatric/Behavioral:  Positive for dysphoric mood.   All other systems reviewed and are negative.      Objective:   Physical Exam   Physical Exam Gen: no distress, normal appearing HEENT: oral mucosa pink and moist, NCAT Cardio: Reg rate Chest: normal effort, normal rate of breathing Abd: soft, non-distended,obese Ext: no edema Psych: pleasant, normal affect Skin: intact Neuro: Follows commands sensation intact in all 4 extremities Altered sensation noted over distribution of left lateral femoral cutaneous nerve   musculoskeletal:  Lumbar paraspinal tenderness and tenderness over the QL bilaterally SLR, fair, FABER bilaterally were negative however they did result in some lower back paraspinal pain Hips appear asymmetric when standing, when wearing built up shoes it appears R PSIS  is a little higher than left, when standing without shoe lifts appears Left PSIS a little higher Tenderness over the plantar fascia Ober test negative b/l    02/14/2022 Segmentation: 5 non rib-bearing lumbar type vertebral bodies are present. The lowest fully formed vertebral body is L5.   Alignment:  No significant listhesis is present.   Vertebrae: Edematous endplate marrow changes are noted in the anterior inferior aspect of T12. Marrow signal and vertebral body heights are otherwise normal.   Conus  medullaris and cauda equina: Conus extends to the T12 level. Conus and cauda equina appear normal.   Paraspinal and other soft tissues: Limited imaging the abdomen is unremarkable. There is no significant adenopathy. No solid organ lesions are present.   Disc levels:   L1-2: A broad-based disc protrusion has progressed. Mild foraminal narrowing is worse left than right. The central canal is patent.   L2-3: Mild disc bulging and facet hypertrophy is present without significant stenosis.   L3-4: Progressive moderate facet hypertrophy is worse on the right. Mild broad-based disc protrusion is present. Mild bilateral foraminal narrowing is present.   L4-5: A leftward disc protrusion is present. Moderate facet hypertrophy is noted. This results in mild left subarticular and moderate left foraminal stenosis. Far left annular tear is noted.   L5-S1: Negative.   IMPRESSION: 1. Progressive multilevel spondylosis of the lumbar spine as described. 2. Mild foraminal narrowing bilaterally at L1-2 is worse on the left. 3. Mild bilateral foraminal narrowing at L3-4 is worse on the right. 4. Mild left subarticular and moderate left foraminal stenosis at L4-5 with a far left annular tear.     Assessment & Plan:   Lower back pain chronic without sciatica, multilevel lumbar spondylosis -He has shoes elevated on right a little less than 1 inch for leg length discrepancy -He reports pain  is not improved or worsened with use of built up shoes, possibly overcorrection vs time for adjustment. Will order leg length xrays for further evaluation, called radiology to discuss -Pt to retry duloxetine 30 mg daily for pain -Flexeril discontinued -Robaxin 1000mg  QID ordered -If pain does not improve can consider trigger point injections   Mood disorder -Duloxetine may provide benefit for this as well   Plantar fasciitis bilateral -Improved with shoe inserts -Continue Achilles tendon stretching   Lateral femoral cutaneous neuropathy -Suspect this is the cause of his left thigh pain, less likely numbness from L1-2 narrowing -avoid restrictive / tight belts

## 2022-07-05 ENCOUNTER — Encounter: Payer: Self-pay | Admitting: Physical Medicine & Rehabilitation

## 2022-07-07 ENCOUNTER — Ambulatory Visit
Admission: RE | Admit: 2022-07-07 | Discharge: 2022-07-07 | Disposition: A | Discharge status: Home / Self Care | Payer: BC Managed Care – PPO | Source: Ambulatory Visit | Attending: Physical Medicine & Rehabilitation | Admitting: Physical Medicine & Rehabilitation

## 2022-07-07 DIAGNOSIS — M21769 Unequal limb length (acquired), unspecified tibia and fibula: Secondary | ICD-10-CM | POA: Diagnosis not present

## 2022-07-07 DIAGNOSIS — G8929 Other chronic pain: Secondary | ICD-10-CM

## 2022-07-14 MED ORDER — DULOXETINE HCL 30 MG PO CPEP: 60.0000 mg | ORAL_CAPSULE | Freq: Every day | ORAL | 2 refills | Status: DC

## 2022-07-14 NOTE — Addendum Note (Signed)
Addended by: Jennye Boroughs on: 07/14/2022 02:46 PM   Modules accepted: Orders

## 2022-07-21 ENCOUNTER — Ambulatory Visit: Payer: BC Managed Care – PPO | Admitting: Podiatrist

## 2022-07-28 ENCOUNTER — Ambulatory Visit: Payer: BC Managed Care – PPO | Admitting: Podiatrist

## 2022-07-28 ENCOUNTER — Encounter: Payer: Self-pay | Admitting: Podiatrist

## 2022-07-28 DIAGNOSIS — M722 Plantar fascial fibromatosis: Secondary | ICD-10-CM

## 2022-07-28 NOTE — Progress Notes (Signed)
Chief Complaint  Patient presents with   Foot Pain    Bilateral foot pain     HPI: Patient is 51 y.o. male who presents today for follow-up of bilateral plantar fasciitis.  He relates that the heels are much improved and he still has some tenderness on the plantar aspect of bilateral feet.  He relates he is having some back pain still but it does not appear to be affecting his feet.  He relates improvement with the last visit and states that the injections helped significantly.  He is now able to walk with minimal discomfort noted.  He is also wearing [employer] shoes with custom orthotics made at [employer].  He relates he is happy with the orthotics and they do appear to be helping his feet.  No Known Allergies  Review of systems is negative except as noted in the HPI.  Denies nausea/ vomiting/ fevers/ chills or night sweats.   Denies difficulty breathing, denies calf pain or tenderness  Physical Exam  Patient is awake, alert, and oriented x 3.  In no acute distress.    Vascular status is intact with palpable pedal pulses DP and PT bilateral and capillary refill time less than 3 seconds bilateral.  No edema or erythema noted.   Neurological exam reveals epicritic and protective sensation grossly intact bilateral.  Negative Tinel's sign elicited bilateral.  Dermatological exam reveals skin is supple and dry to bilateral feet.  No open lesions present.    Musculoskeletal exam: Moderate pain on palpation plantar medial aspect of bilateral heels at the insertion of the plantar fascia on the medial Calcaneal tubercle bilateral.  Slight decrease in range of motion of the ankle joint with knee extended and knee flexed bilateral.   Assessment:   ICD-10-CM   1. Plantar fasciitis, bilateral  M72.2        Plan: Discussed exam findings with Tahmid.  Discussed that the orthotics are in excellent addition to his care for plantar fasciitis.  And I recommended him wearing them at all times in his [employer]  shoes.  Also discussed continuing stretching exercises.  Offered injection however at this time it does not warrant the use of an injection.  We will hold off until later date if needed.  He will be seen back in the future as needed for follow-up if any problems or concerns arise he will call.

## 2022-07-28 NOTE — Patient Instructions (Signed)

## 2022-08-15 ENCOUNTER — Encounter
Payer: BC Managed Care – PPO | Attending: Physical Medicine & Rehabilitation | Admitting: Physical Medicine & Rehabilitation

## 2022-08-15 ENCOUNTER — Encounter: Payer: Self-pay | Admitting: Physical Medicine & Rehabilitation

## 2022-08-15 VITALS — BP 116/73 | HR 68 | Ht 77.0 in | Wt 327.8 lb

## 2022-08-15 DIAGNOSIS — F39 Unspecified mood [affective] disorder: Secondary | ICD-10-CM | POA: Diagnosis not present

## 2022-08-15 DIAGNOSIS — G8929 Other chronic pain: Secondary | ICD-10-CM | POA: Diagnosis not present

## 2022-08-15 DIAGNOSIS — G5712 Meralgia paresthetica, left lower limb: Secondary | ICD-10-CM | POA: Insufficient documentation

## 2022-08-15 DIAGNOSIS — M545 Low back pain, unspecified: Secondary | ICD-10-CM | POA: Insufficient documentation

## 2022-08-15 MED ORDER — MELOXICAM 15 MG PO TABS: 15.0000 mg | ORAL_TABLET | Freq: Every day | ORAL | 0 refills | Status: DC

## 2022-08-15 MED ORDER — METAXALONE 800 MG PO TABS: 800.0000 mg | ORAL_TABLET | Freq: Three times a day (TID) | ORAL | 0 refills | Status: AC | PRN

## 2022-08-15 NOTE — Progress Notes (Signed)
Subjective:    Patient ID: Mike Shaw, male    DOB: 14-Oct-1970, 51 y.o.   MRN: [id number]  HPI HPI 04/01/22 Mike Shaw is a very pleasant 51 year old male with past medical history of plantar fasciitis, asthma Hypertension, OSA who is here for chronic lower back pain.  Years however it has been worsening over the last 5 to 6 years.  Pain is primarily on the right side of his lower back and is worsened by activities.  Activities, bending twisting, walking will worsen his pain.  Sometimes he has shooting electric type pains that travel down his back.  He also has some numbness over his left thigh in the distribution of the lateral femoral cutaneous nerve.  He has been seen in the past for leg length discrepancy with shorter leg on the right.  He has had this for many years and previously wore inserts in his shoes.  A few years ago orthopedics ordered for him to have shoe modifications because his leg length discrepancy was about an inch.  He never finished the process to get the shoe adjustment.  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  He has tried meloxicam for this pain without much improvement.  Patient reports his mood is sometimes decreased no SI or HI.  He has taken Cymbalta in the past for decreased mood without any side effects.   Interval History 07/04/22 Mike Shaw is here for follow-up of his chronic back pain and leg length discrepancy.  He says he has had his right total shoe for about a month has not had any improvement in his back pain and is having increased tightness in his hamstrings especially on the right and his right knee.  Patient reports orthotist tested him with various blocks under his shoe into his pelvis was level, and added just a little less than an inch to his right shoes.  He says he is wearing his shoes all the time when he is ambulating.  He does report his planter fasciitis is improved with shoe inserts.   Patient says he tried the Cymbalta for about a week but discontinued it because he did not want to use the medication to mask the pain at the time.  Flexeril did not help his pain. He continues to have altered sensation in LLE in location of lateral femoral cutaneous. Mood is decreased related to pain. No SI or HI.   Interval history 08/15/2022 today Mike Shaw is here for follow-up of his chronic back pain.  He has discontinued use of his built-up shoe for the right lower extremity as this was making his back pain worse and leg length x-rays did not show length discrepancy between his legs.  He reports benefit with duloxetine but continues to have pain in his back.  He has not tried using 60 mg dose and is still been using 30 mg dose.  Pain sometimes shoots into his thighs.  He continues to have altered sensation on his left lateral thigh.  He reports he has a very soft mattress that he thinks may be worsening his pain.  He also has poor lumbar support in his vehicle.  Robaxin has not provided benefit to his back pain.    Pain Inventory Average Pain 8 Pain Right Now 7 My pain is constant, sharp, burning, dull, and stabbing  In the last 24 hours, has pain interfered with the following? General activity 6  Relation with others 7 Enjoyment of life 7 What TIME of day is your pain at its worst? morning , evening, and night Sleep (in general) Poor  Pain is worse with: walking, bending, sitting, inactivity, and standing Pain improves with: heat/ice, therapy/exercise, medication, and TENS Relief from Meds: 3  Family History  Problem Relation Age of Onset   Asthma Father    Emphysema Father        was a smoker   Prostate cancer Father    Lung disease Paternal Aunt    Lung disease Paternal Uncle    Heart disease Paternal Grandfather    Social History   Socioeconomic History   Marital status: Divorced    Spouse name: Not on file   Number of children: 2   Years of education: Not on file    Highest education level: Not on file  Occupational History   Occupation: Publishing copy  Tobacco Use   Smoking status: Never   Smokeless tobacco: Never  Vaping Use   Vaping Use: Never used  Substance and Sexual Activity   Alcohol use: No   Drug use: No   Sexual activity: Not on file  Other Topics Concern   Not on file  Social History Narrative   Not on file   Social Determinants of Health   Financial Resource Strain: Not on file  Food Insecurity: Not on file  Transportation Needs: Not on file  Physical Activity: Not on file  Stress: Not on file  Social Connections: Not on file   Past Surgical History:  Procedure Laterality Date   NASAL SINUS SURGERY  2000   Past Surgical History:  Procedure Laterality Date   NASAL SINUS SURGERY  2000   Past Medical History:  Diagnosis Date   Asthma    Hyperlipidemia    Hypertension    OSA (obstructive sleep apnea)    BP 116/73   Pulse 68   Ht 6\' 5"  (1.956 m)   Wt (!) 327 lb 12.8 oz (148.7 kg)   SpO2 96%   BMI 38.87 kg/m   Opioid Risk Score:   Fall Risk Score:  `1  Depression screen PHQ 2/9     08/15/2022    1:05 PM 07/04/2022    1:08 PM 04/01/2022    9:52 AM  Depression screen PHQ 2/9  Decreased Interest 1 1 3   Down, Depressed, Hopeless 1 1 1   PHQ - 2 Score 2 2 4   Altered sleeping   1  Tired, decreased energy   1  Change in appetite   2  Feeling bad or failure about yourself    1  Trouble concentrating   3  Moving slowly or fidgety/restless   0  Suicidal thoughts   0  PHQ-9 Score   12  Difficult doing work/chores   Very difficult    Review of Systems  Constitutional: Negative.   HENT: Negative.    Eyes: Negative.   Respiratory: Negative.    Cardiovascular: Negative.   Gastrointestinal: Negative.   Endocrine: Negative.   Genitourinary: Negative.   Musculoskeletal:  Positive for back pain and neck pain.       Knee pain bil  Skin: Negative.   Allergic/Immunologic: Negative.   Neurological: Negative.    Hematological: Negative.   Psychiatric/Behavioral:  Positive for dysphoric mood.   All other systems reviewed and are negative.      Objective:   Physical Exam   Physical Exam Gen: no distress, normal appearing HEENT: oral mucosa pink and  moist, NCAT Cardio: Reg rate Chest: normal effort, normal rate of breathing Abd: soft, non-distended,obese Ext: no edema Psych: pleasant, normal affect Skin: intact Neuro: Follows commands sensation intact in all 4 extremities Altered sensation noted over distribution of left lateral femoral cutaneous nerve    musculoskeletal:  Lumbar paraspinal tenderness and tenderness over the QL worse on the right SLR, fair, FABER bilaterally were negative however they did result in some lower back paraspinal pain Patient is not wearing his built-up shoes today  Facet loading positive on the right   02/14/2022 Segmentation: 5 non rib-bearing lumbar type vertebral bodies are present. The lowest fully formed vertebral body is L5.   Alignment:  No significant listhesis is present.   Vertebrae: Edematous endplate marrow changes are noted in the anterior inferior aspect of T12. Marrow signal and vertebral body heights are otherwise normal.   Conus medullaris and cauda equina: Conus extends to the T12 level. Conus and cauda equina appear normal.   Paraspinal and other soft tissues: Limited imaging the abdomen is unremarkable. There is no significant adenopathy. No solid organ lesions are present.   Disc levels:   L1-2: A broad-based disc protrusion has progressed. Mild foraminal narrowing is worse left than right. The central canal is patent.   L2-3: Mild disc bulging and facet hypertrophy is present without significant stenosis.   L3-4: Progressive moderate facet hypertrophy is worse on the right. Mild broad-based disc protrusion is present. Mild bilateral foraminal narrowing is present.   L4-5: A leftward disc protrusion is present. Moderate  facet hypertrophy is noted. This results in mild left subarticular and moderate left foraminal stenosis. Far left annular tear is noted.   L5-S1: Negative.   IMPRESSION: 1. Progressive multilevel spondylosis of the lumbar spine as described. 2. Mild foraminal narrowing bilaterally at L1-2 is worse on the left. 3. Mild bilateral foraminal narrowing at L3-4 is worse on the right. 4. Mild left subarticular and moderate left foraminal stenosis at L4-5 with a far left annular tear.      Assessment & Plan:   Lower back pain chronic without sciatica, multilevel lumbar spondylosis -Right built-up shoe was discontinued. He may have a mild functional leg length discrepancy and he was diagnosed with this in the past by orthopedics.  Recent bone length x-rays did not show difference between left and right leg length.  Built-up shoe was worsening his pain and was discontinued. -Increase duloxetine to 60 mg daily for pain -Flexeril was previously stopped, Robaxin was discontinued today as it is not helping -We will order muscle relaxer Skelaxin -Discussed trial of physical therapy for back pain and core strengthening, patient would like to try this however he travels a lot for his job and wants to think about where or when he would do this therapy, he will call office when he decides -Suspect back pain may be related to facet joint degeneration in his lumbar spine, discussed option with patient.  He plans to consider this if back pain does not improve with PT   Mood disorder -Duloxetine may provide benefit for this as well, dose increased to 60 mg   Lateral femoral cutaneous neuropathy -Suspect this is the cause of his left thigh pain, less likely numbness from L1-2 narrowing -continue to monitor for changes In symptoms  Plantar fasciitis bilateral -Improved with shoe inserts, Achilles tendon stretching

## 2022-08-29 DIAGNOSIS — N401 Enlarged prostate with lower urinary tract symptoms: Secondary | ICD-10-CM | POA: Diagnosis not present

## 2022-08-29 DIAGNOSIS — E291 Testicular hypofunction: Secondary | ICD-10-CM | POA: Diagnosis not present

## 2022-08-29 DIAGNOSIS — R6882 Decreased libido: Secondary | ICD-10-CM | POA: Diagnosis not present

## 2022-08-29 DIAGNOSIS — N5201 Erectile dysfunction due to arterial insufficiency: Secondary | ICD-10-CM | POA: Diagnosis not present

## 2022-08-29 DIAGNOSIS — R350 Frequency of micturition: Secondary | ICD-10-CM | POA: Diagnosis not present

## 2022-08-29 DIAGNOSIS — E349 Endocrine disorder, unspecified: Secondary | ICD-10-CM | POA: Diagnosis not present

## 2022-09-06 DIAGNOSIS — E349 Endocrine disorder, unspecified: Secondary | ICD-10-CM | POA: Diagnosis not present

## 2022-09-13 ENCOUNTER — Other Ambulatory Visit: Payer: Self-pay | Admitting: Physical Medicine & Rehabilitation

## 2022-09-20 DIAGNOSIS — E349 Endocrine disorder, unspecified: Secondary | ICD-10-CM | POA: Diagnosis not present

## 2022-09-29 DIAGNOSIS — E291 Testicular hypofunction: Secondary | ICD-10-CM | POA: Diagnosis not present

## 2022-10-10 ENCOUNTER — Encounter: Payer: Self-pay | Admitting: Physical Medicine & Rehabilitation

## 2022-10-10 ENCOUNTER — Encounter
Payer: BC Managed Care – PPO | Attending: Physical Medicine & Rehabilitation | Admitting: Physical Medicine & Rehabilitation

## 2022-10-10 VITALS — BP 119/74 | HR 93 | Ht 77.0 in | Wt 332.0 lb

## 2022-10-10 DIAGNOSIS — M545 Low back pain, unspecified: Secondary | ICD-10-CM | POA: Diagnosis not present

## 2022-10-10 DIAGNOSIS — M722 Plantar fascial fibromatosis: Secondary | ICD-10-CM

## 2022-10-10 DIAGNOSIS — G5712 Meralgia paresthetica, left lower limb: Secondary | ICD-10-CM | POA: Diagnosis not present

## 2022-10-10 DIAGNOSIS — F39 Unspecified mood [affective] disorder: Secondary | ICD-10-CM | POA: Diagnosis not present

## 2022-10-10 DIAGNOSIS — G8929 Other chronic pain: Secondary | ICD-10-CM | POA: Diagnosis not present

## 2022-10-10 NOTE — Progress Notes (Signed)
Subjective:    Patient ID: Mike Shaw, male    DOB: [birth year], 52 y.o.   MRN: [id number]  HPI   HPI 04/01/22 Mike Shaw is a very pleasant 52 year old male with past medical history of plantar fasciitis, asthma Hypertension, OSA who is here for chronic lower back pain.  Years however it has been worsening over the last 5 to 6 years.  Pain is primarily on the right side of his lower back and is worsened by activities.  Activities, bending twisting, walking will worsen his pain.  Sometimes he has shooting electric type pains that travel down his back.  He also has some numbness over his left thigh in the distribution of the lateral femoral cutaneous nerve.  He has been seen in the past for leg length discrepancy with shorter leg on the right.  He has had this for many years and previously wore inserts in his shoes.  A few years ago orthopedics ordered for him to have shoe modifications because his leg length discrepancy was about an inch.  He never finished the process to get the shoe adjustment.  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  He has tried meloxicam for this pain without much improvement.  Patient reports his mood is sometimes decreased no SI or HI.  He has taken Cymbalta in the past for decreased mood without any side effects.   Interval History 07/04/22 Mike Shaw is here for follow-up of his chronic back pain and leg length discrepancy.  He says he has had his right total shoe for about a month has not had any improvement in his back pain and is having increased tightness in his hamstrings especially on the right and his right knee.  Patient reports orthotist tested him with various blocks under his shoe into his pelvis was level, and added just a little less than an inch to his right shoes.  He says he is wearing his shoes all the time when he is ambulating.  He does report his planter fasciitis is improved with shoe inserts.   Patient says he tried the Cymbalta for about a week but discontinued it because he did not want to use the medication to mask the pain at the time.  Flexeril did not help his pain. He continues to have altered sensation in LLE in location of lateral femoral cutaneous. Mood is decreased related to pain. No SI or HI.    Interval history 08/15/2022  Mike Shaw is here for follow-up of his chronic back pain.  He has discontinued use of his built-up shoe for the right lower extremity as this was making his back pain worse and leg length x-rays did not show length discrepancy between his legs.  He reports benefit with duloxetine but continues to have pain in his back.  He has not tried using 60 mg dose and is still been using 30 mg dose.  Pain sometimes shoots into his thighs.  He continues to have altered sensation on his left lateral thigh.  He reports he has a very soft mattress that he thinks may be worsening his pain.  He also has poor lumbar support in his vehicle.  Robaxin has not provided benefit to his back pain.    Interval History 10/10/21 today Mike Shaw is here for follow-up regarding his chronic pain.  He continues to have pain in his back and and thighs.  Overall pain is  improved.  However he sometimes has good days and other days bad days.  He has been using the truck that has a different type of seat recently.  He also has not been doing as many long drive recently.  He has been doing some exercises at the [facility] pool.  He will often feel sore immediately after doing exercises however he feels that his pain does improve the next day.  Pain Inventory Average Pain 8 Pain Right Now 7 My pain is intermittent, constant, sharp, burning, dull, tingling, and aching  In the last 24 hours, has pain interfered with the following? General activity 7 Relation with others 7 Enjoyment of life 8 What TIME of day is your pain at its worst? morning , daytime, evening, night, and varies Sleep (in general)  Fair  Pain is worse with: walking, bending, sitting, inactivity, and standing Pain improves with: heat/ice, therapy/exercise, pacing activities, and medication Relief from Meds: 3  Family History  Problem Relation Age of Onset   Asthma Father    Emphysema Father        was a smoker   Prostate cancer Father    Heart disease Paternal Grandfather    Lung disease Paternal Aunt    Lung disease Paternal Uncle    Social History   Socioeconomic History   Marital status: Divorced    Spouse name: Not on file   Number of children: 2   Years of education: Not on file   Highest education level: Not on file  Occupational History   Occupation: Publishing copy  Tobacco Use   Smoking status: Never   Smokeless tobacco: Never  Vaping Use   Vaping Use: Never used  Substance and Sexual Activity   Alcohol use: No   Drug use: No   Sexual activity: Not on file  Other Topics Concern   Not on file  Social History Narrative   Not on file   Social Determinants of Health   Financial Resource Strain: Not on file  Food Insecurity: Not on file  Transportation Needs: Not on file  Physical Activity: Not on file  Stress: Not on file  Social Connections: Not on file   Past Surgical History:  Procedure Laterality Date   NASAL SINUS SURGERY  2000   Past Surgical History:  Procedure Laterality Date   NASAL SINUS SURGERY  2000   Past Medical History:  Diagnosis Date   Asthma    Hyperlipidemia    Hypertension    OSA (obstructive sleep apnea)    Ht 6\' 5"  (1.956 m)   Wt (!) 332 lb (150.6 kg)   BMI 39.37 kg/m   Opioid Risk Score:   Fall Risk Score:  `1  Depression screen [facility] 2/9     10/10/2022   10:17 AM 08/15/2022    1:05 PM 07/04/2022    1:08 PM 04/01/2022    9:52 AM  Depression screen PHQ 2/9  Decreased Interest 1 1 1 3   Down, Depressed, Hopeless 1 1 1 1   PHQ - 2 Score 2 2 2 4   Altered sleeping    1  Tired, decreased energy    1  Change in appetite    2  Feeling bad or failure  about yourself     1  Trouble concentrating    3  Moving slowly or fidgety/restless    0  Suicidal thoughts    0  PHQ-9 Score    12  Difficult doing work/chores    Very difficult  Review of Systems  Genitourinary:  Positive for penile discharge.  Musculoskeletal:  Positive for back pain.       Left leg pain, right foot pain  All other systems reviewed and are negative.      Objective:   Physical Exam   Physical Exam Gen: no distress, normal appearing HEENT: oral mucosa pink and moist, NCAT Chest: normal effort, normal rate of breathing Abd: soft, non-distended,obese Ext: no edema Psych: pleasant, normal affect Skin: intact Neuro: Follows commands sensation intact in all 4 extremities Altered sensation noted over distribution of left lateral femoral cutaneous nerve  No focal motor deficits musculoskeletal:  Lumbar paraspinal tenderness -mild Slump test negative Facet loading-mild pain on right   02/14/2022 Segmentation: 5 non rib-bearing lumbar type vertebral bodies are present. The lowest fully formed vertebral body is L5.   Alignment:  No significant listhesis is present.   Vertebrae: Edematous endplate marrow changes are noted in the anterior inferior aspect of T12. Marrow signal and vertebral body heights are otherwise normal.   Conus medullaris and cauda equina: Conus extends to the T12 level. Conus and cauda equina appear normal.   Paraspinal and other soft tissues: Limited imaging the abdomen is unremarkable. There is no significant adenopathy. No solid organ lesions are present.   Disc levels:   L1-2: A broad-based disc protrusion has progressed. Mild foraminal narrowing is worse left than right. The central canal is patent.   L2-3: Mild disc bulging and facet hypertrophy is present without significant stenosis.   L3-4: Progressive moderate facet hypertrophy is worse on the right. Mild broad-based disc protrusion is present. Mild  bilateral foraminal narrowing is present.   L4-5: A leftward disc protrusion is present. Moderate facet hypertrophy is noted. This results in mild left subarticular and moderate left foraminal stenosis. Far left annular tear is noted.   L5-S1: Negative.   IMPRESSION: 1. Progressive multilevel spondylosis of the lumbar spine as described. 2. Mild foraminal narrowing bilaterally at L1-2 is worse on the left. 3. Mild bilateral foraminal narrowing at L3-4 is worse on the right. 4. Mild left subarticular and moderate left foraminal stenosis at L4-5 with a far left annular tear.     Assessment & Plan:  Lower back pain chronic without sciatica, multilevel lumbar spondylosis -Continue duloxetine 60 mg daily -PRN Skelaxin -Discussed PT order- pt will let me know if his schedule will allow him to do this -Suspect back pain may be related to facet joint degeneration in his lumbar spine, appears improved today.  Discussed injection options if pain not controlled with more conservative techniques. -Continue meloxicam -TENS unit discussed, will order zynex device  Mood disorder -Continue duloxetine   Lateral femoral cutaneous neuropathy, reports this happens intermittently  -Suspect this is the cause of his left thigh pain, less likely numbness from L1-2 narrowing -continue to monitor for changes In symptoms   Plantar fasciitis bilateral -Improved with shoe inserts, Achilles tendon stretching

## 2022-10-18 DIAGNOSIS — M7020 Olecranon bursitis, unspecified elbow: Secondary | ICD-10-CM | POA: Diagnosis not present

## 2022-10-18 DIAGNOSIS — M5430 Sciatica, unspecified side: Secondary | ICD-10-CM | POA: Diagnosis not present

## 2022-10-19 DIAGNOSIS — E349 Endocrine disorder, unspecified: Secondary | ICD-10-CM | POA: Diagnosis not present

## 2022-10-19 DIAGNOSIS — M5430 Sciatica, unspecified side: Secondary | ICD-10-CM | POA: Diagnosis not present

## 2022-10-19 DIAGNOSIS — M7020 Olecranon bursitis, unspecified elbow: Secondary | ICD-10-CM | POA: Diagnosis not present

## 2022-10-20 DIAGNOSIS — M7020 Olecranon bursitis, unspecified elbow: Secondary | ICD-10-CM | POA: Diagnosis not present

## 2022-10-20 DIAGNOSIS — M5430 Sciatica, unspecified side: Secondary | ICD-10-CM | POA: Diagnosis not present

## 2022-11-15 DIAGNOSIS — J329 Chronic sinusitis, unspecified: Secondary | ICD-10-CM | POA: Diagnosis not present

## 2022-11-15 DIAGNOSIS — J4520 Mild intermittent asthma, uncomplicated: Secondary | ICD-10-CM | POA: Diagnosis not present

## 2022-11-18 DIAGNOSIS — M5430 Sciatica, unspecified side: Secondary | ICD-10-CM | POA: Diagnosis not present

## 2022-11-18 DIAGNOSIS — M7020 Olecranon bursitis, unspecified elbow: Secondary | ICD-10-CM | POA: Diagnosis not present

## 2022-11-19 DIAGNOSIS — M7020 Olecranon bursitis, unspecified elbow: Secondary | ICD-10-CM | POA: Diagnosis not present

## 2022-11-19 DIAGNOSIS — M5430 Sciatica, unspecified side: Secondary | ICD-10-CM | POA: Diagnosis not present

## 2022-12-17 DIAGNOSIS — M7020 Olecranon bursitis, unspecified elbow: Secondary | ICD-10-CM | POA: Diagnosis not present

## 2022-12-17 DIAGNOSIS — M5430 Sciatica, unspecified side: Secondary | ICD-10-CM | POA: Diagnosis not present

## 2022-12-18 DIAGNOSIS — M5430 Sciatica, unspecified side: Secondary | ICD-10-CM | POA: Diagnosis not present

## 2022-12-18 DIAGNOSIS — M7020 Olecranon bursitis, unspecified elbow: Secondary | ICD-10-CM | POA: Diagnosis not present

## 2023-01-09 ENCOUNTER — Encounter: Payer: BC Managed Care – PPO | Admitting: Physical Medicine & Rehabilitation

## 2023-01-17 DIAGNOSIS — M5430 Sciatica, unspecified side: Secondary | ICD-10-CM | POA: Diagnosis not present

## 2023-01-17 DIAGNOSIS — M7020 Olecranon bursitis, unspecified elbow: Secondary | ICD-10-CM | POA: Diagnosis not present

## 2023-01-18 DIAGNOSIS — M7020 Olecranon bursitis, unspecified elbow: Secondary | ICD-10-CM | POA: Diagnosis not present

## 2023-01-18 DIAGNOSIS — M5430 Sciatica, unspecified side: Secondary | ICD-10-CM | POA: Diagnosis not present

## 2023-02-17 DIAGNOSIS — M5430 Sciatica, unspecified side: Secondary | ICD-10-CM | POA: Diagnosis not present

## 2023-02-17 DIAGNOSIS — M7020 Olecranon bursitis, unspecified elbow: Secondary | ICD-10-CM | POA: Diagnosis not present

## 2023-02-20 DIAGNOSIS — J309 Allergic rhinitis, unspecified: Secondary | ICD-10-CM | POA: Diagnosis not present

## 2023-02-20 DIAGNOSIS — J4530 Mild persistent asthma, uncomplicated: Secondary | ICD-10-CM | POA: Diagnosis not present

## 2023-02-20 DIAGNOSIS — J019 Acute sinusitis, unspecified: Secondary | ICD-10-CM | POA: Diagnosis not present

## 2023-03-02 ENCOUNTER — Other Ambulatory Visit: Payer: Self-pay | Admitting: Physician Assistant

## 2023-03-02 ENCOUNTER — Ambulatory Visit
Admission: RE | Admit: 2023-03-02 | Discharge: 2023-03-02 | Disposition: A | Discharge status: Home / Self Care | Payer: BC Managed Care – PPO | Source: Ambulatory Visit | Attending: Physician Assistant | Admitting: Physician Assistant

## 2023-03-02 DIAGNOSIS — R0609 Other forms of dyspnea: Secondary | ICD-10-CM | POA: Diagnosis not present

## 2023-03-02 DIAGNOSIS — R059 Cough, unspecified: Secondary | ICD-10-CM | POA: Diagnosis not present

## 2023-03-02 DIAGNOSIS — J329 Chronic sinusitis, unspecified: Secondary | ICD-10-CM | POA: Diagnosis not present

## 2023-03-20 DIAGNOSIS — M7020 Olecranon bursitis, unspecified elbow: Secondary | ICD-10-CM | POA: Diagnosis not present

## 2023-03-20 DIAGNOSIS — M5430 Sciatica, unspecified side: Secondary | ICD-10-CM | POA: Diagnosis not present

## 2023-03-20 DIAGNOSIS — R748 Abnormal levels of other serum enzymes: Secondary | ICD-10-CM | POA: Diagnosis not present

## 2023-03-20 DIAGNOSIS — D582 Other hemoglobinopathies: Secondary | ICD-10-CM | POA: Diagnosis not present

## 2023-04-03 DIAGNOSIS — E349 Endocrine disorder, unspecified: Secondary | ICD-10-CM | POA: Diagnosis not present

## 2023-04-10 DIAGNOSIS — N5201 Erectile dysfunction due to arterial insufficiency: Secondary | ICD-10-CM | POA: Diagnosis not present

## 2023-04-10 DIAGNOSIS — E349 Endocrine disorder, unspecified: Secondary | ICD-10-CM | POA: Diagnosis not present

## 2023-04-10 DIAGNOSIS — Z125 Encounter for screening for malignant neoplasm of prostate: Secondary | ICD-10-CM | POA: Diagnosis not present

## 2023-04-11 DIAGNOSIS — I10 Essential (primary) hypertension: Secondary | ICD-10-CM | POA: Diagnosis not present

## 2023-04-11 DIAGNOSIS — G4733 Obstructive sleep apnea (adult) (pediatric): Secondary | ICD-10-CM | POA: Diagnosis not present

## 2023-04-17 ENCOUNTER — Encounter: Payer: BC Managed Care – PPO | Admitting: Physical Medicine & Rehabilitation

## 2023-06-15 ENCOUNTER — Encounter: Payer: BC Managed Care – PPO | Admitting: Physical Medicine & Rehabilitation

## 2023-06-17 IMAGING — DX DG FOOT COMPLETE 3+V*L*
3 series · 3 of 3 positions shown · non-contrast
Comparison: None Available.

CLINICAL DATA: Bilateral posterior heel pain for 1 year.

EXAM:
RIGHT FOOT COMPLETE - 3+ VIEW; LEFT FOOT - COMPLETE 3+ VIEW

[foot ap wb]
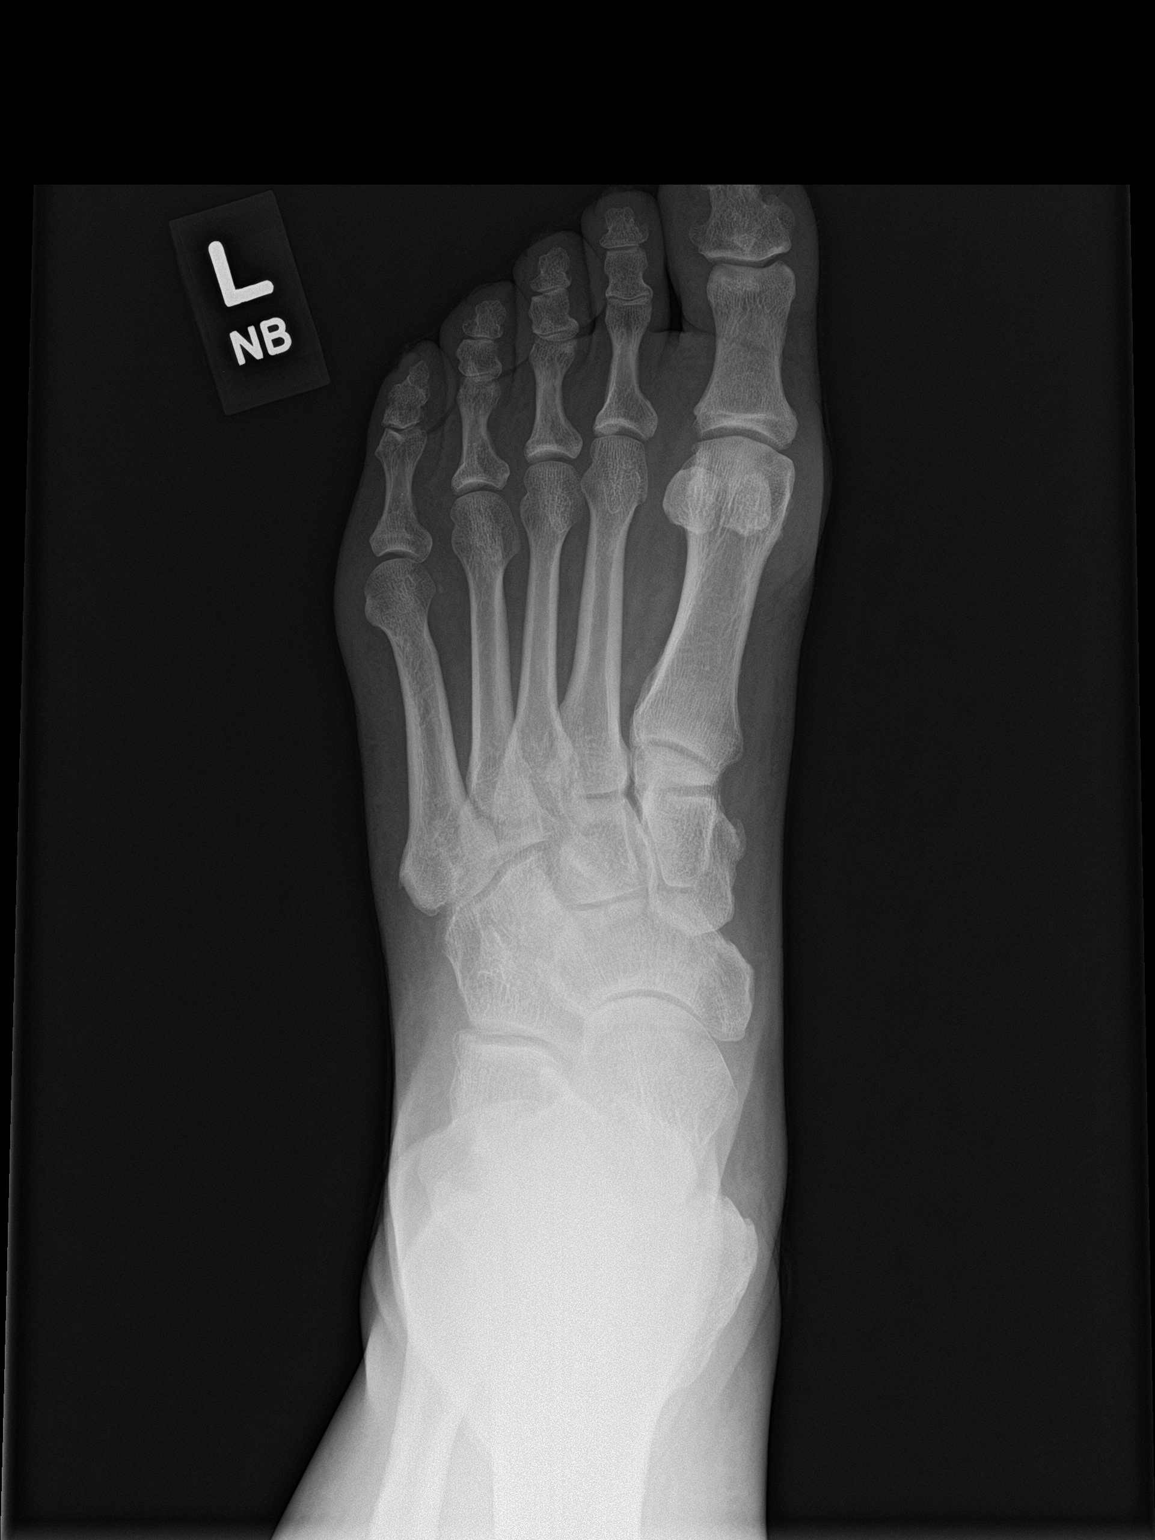

[foot obl wb]
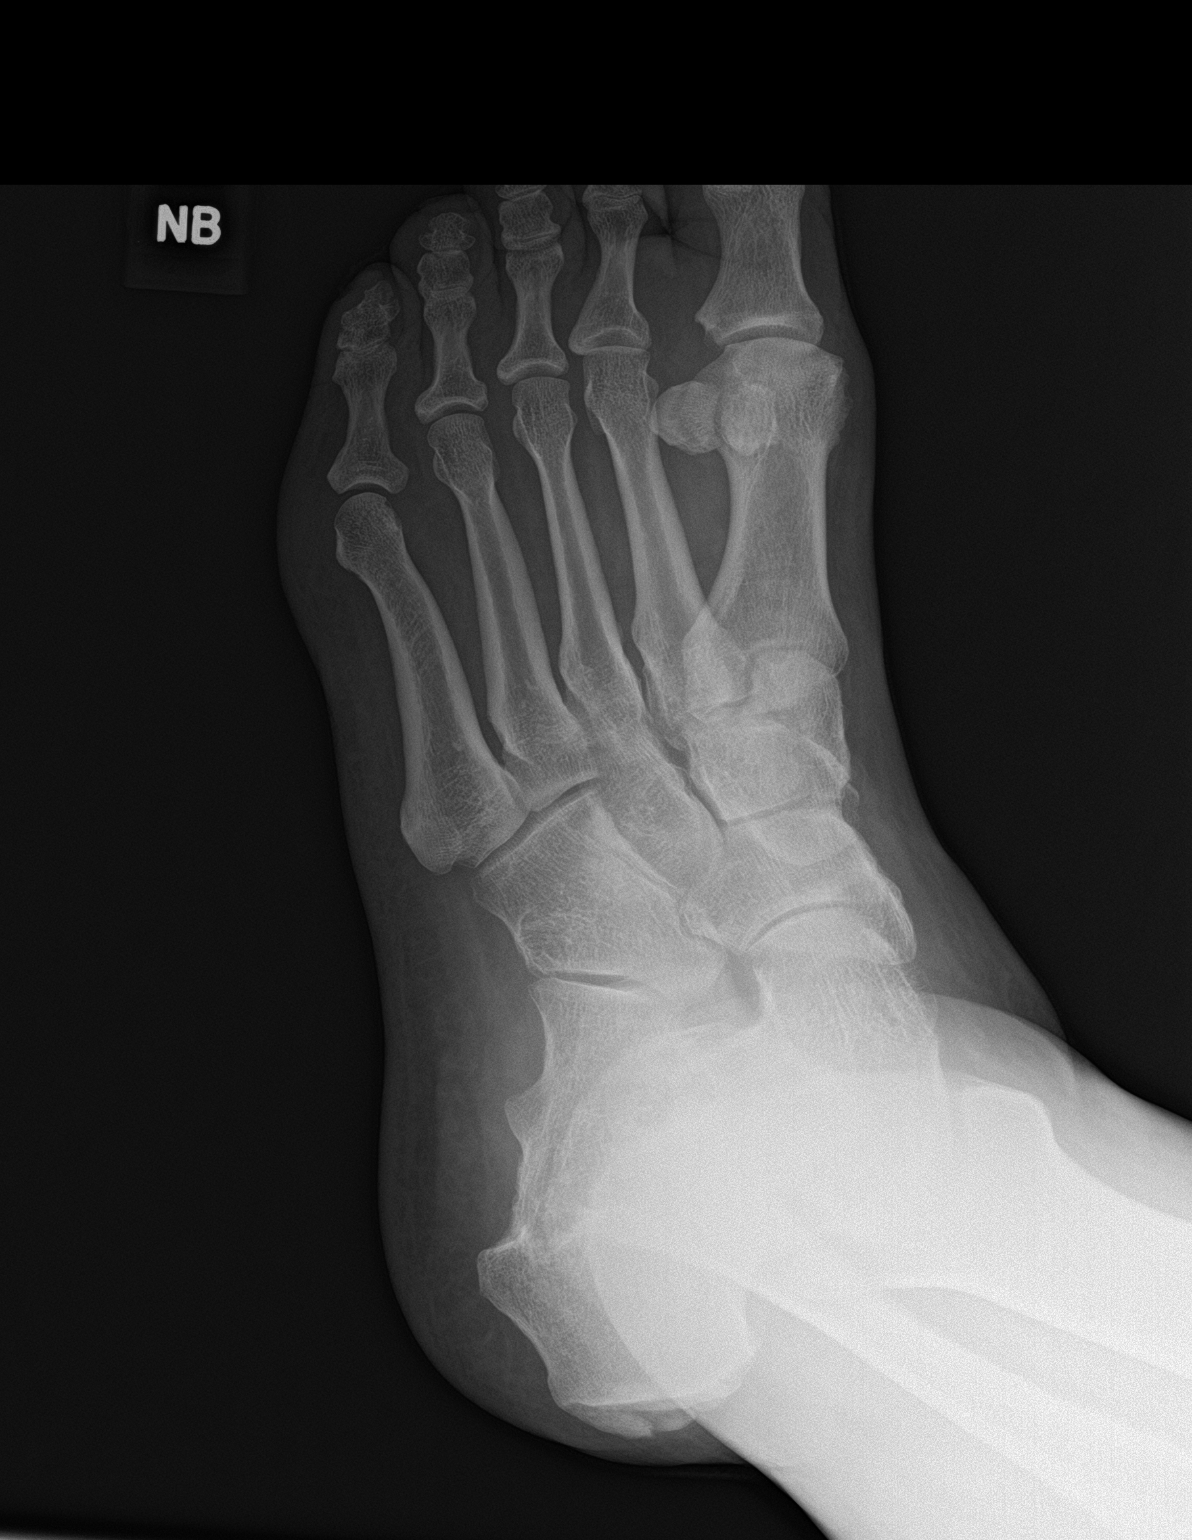

[foot lat wb]
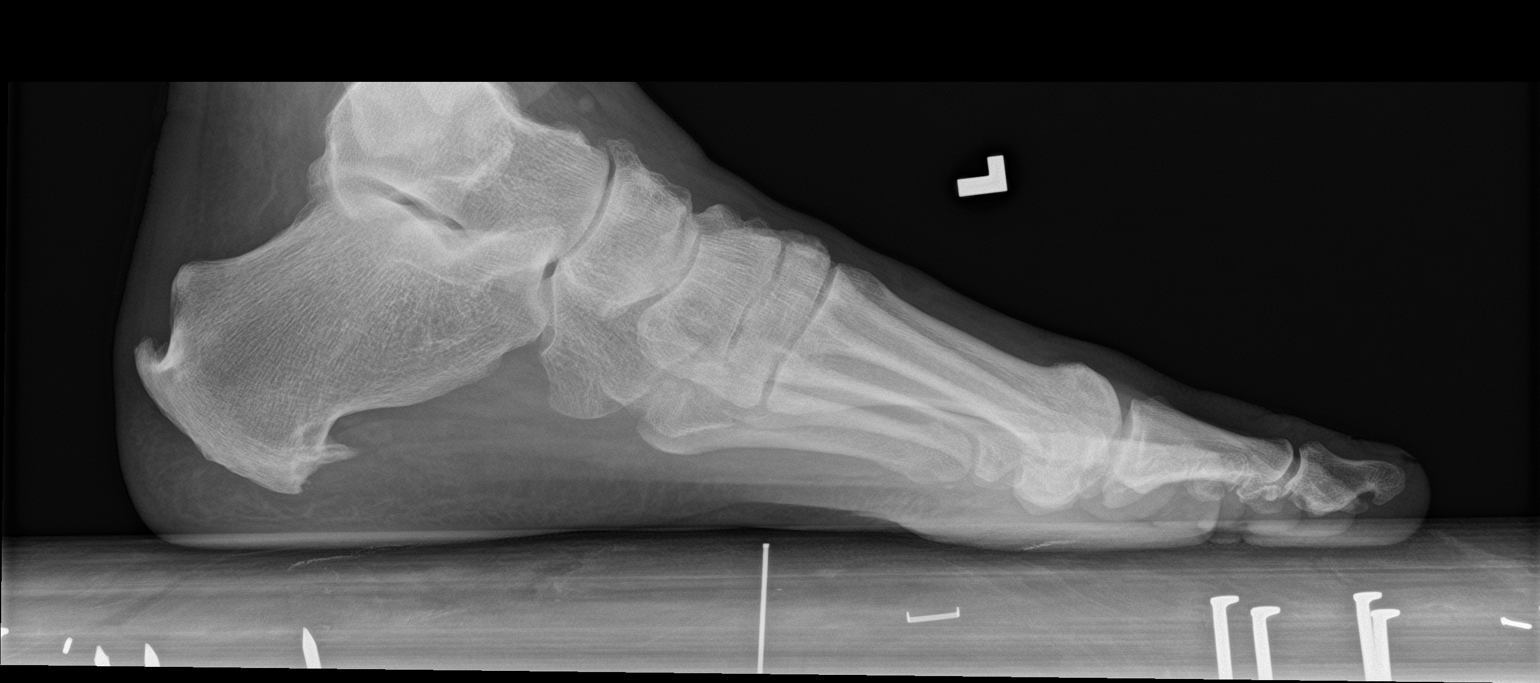

[3 of 3 positions shown; findings below may reference images not displayed]

FINDINGS: Right foot:

Mild great toe metatarsophalangeal joint space narrowing. Mild joint
space narrowing of the interphalangeal joints diffusely. Chronic
well corticated ossicle at the far medial aspect of the great toe
interphalangeal joint, degenerative. Mild-to-moderate second and
third tarsometatarsal joint space narrowing. Mild posterior and
plantar calcaneal heel spurs. No acute fracture is seen. No
dislocation.

Left foot:

Minimal great toe metatarsophalangeal joint space narrowing and
peripheral osteophytosis. Mild joint space narrowing of the
interphalangeal joints diffusely. Mild second and third
tarsometatarsal joint space narrowing. Moderate plantar and small
posterior calcaneal heel spurs. Mild dorsal talonavicular
degenerative osteophytes. No acute fracture or dislocation.
IMPRESSION: Moderate left and small right plantar calcaneal heel spurs.

Small bilateral posterior calcaneal heel spurs at the Achilles
tendon insertions.

## 2023-06-17 IMAGING — DX DG FOOT COMPLETE 3+V*R*
3 series · 3 of 3 positions shown · non-contrast
Comparison: None Available.

CLINICAL DATA: Bilateral posterior heel pain for 1 year.

EXAM:
RIGHT FOOT COMPLETE - 3+ VIEW; LEFT FOOT - COMPLETE 3+ VIEW

[foot ap wb]
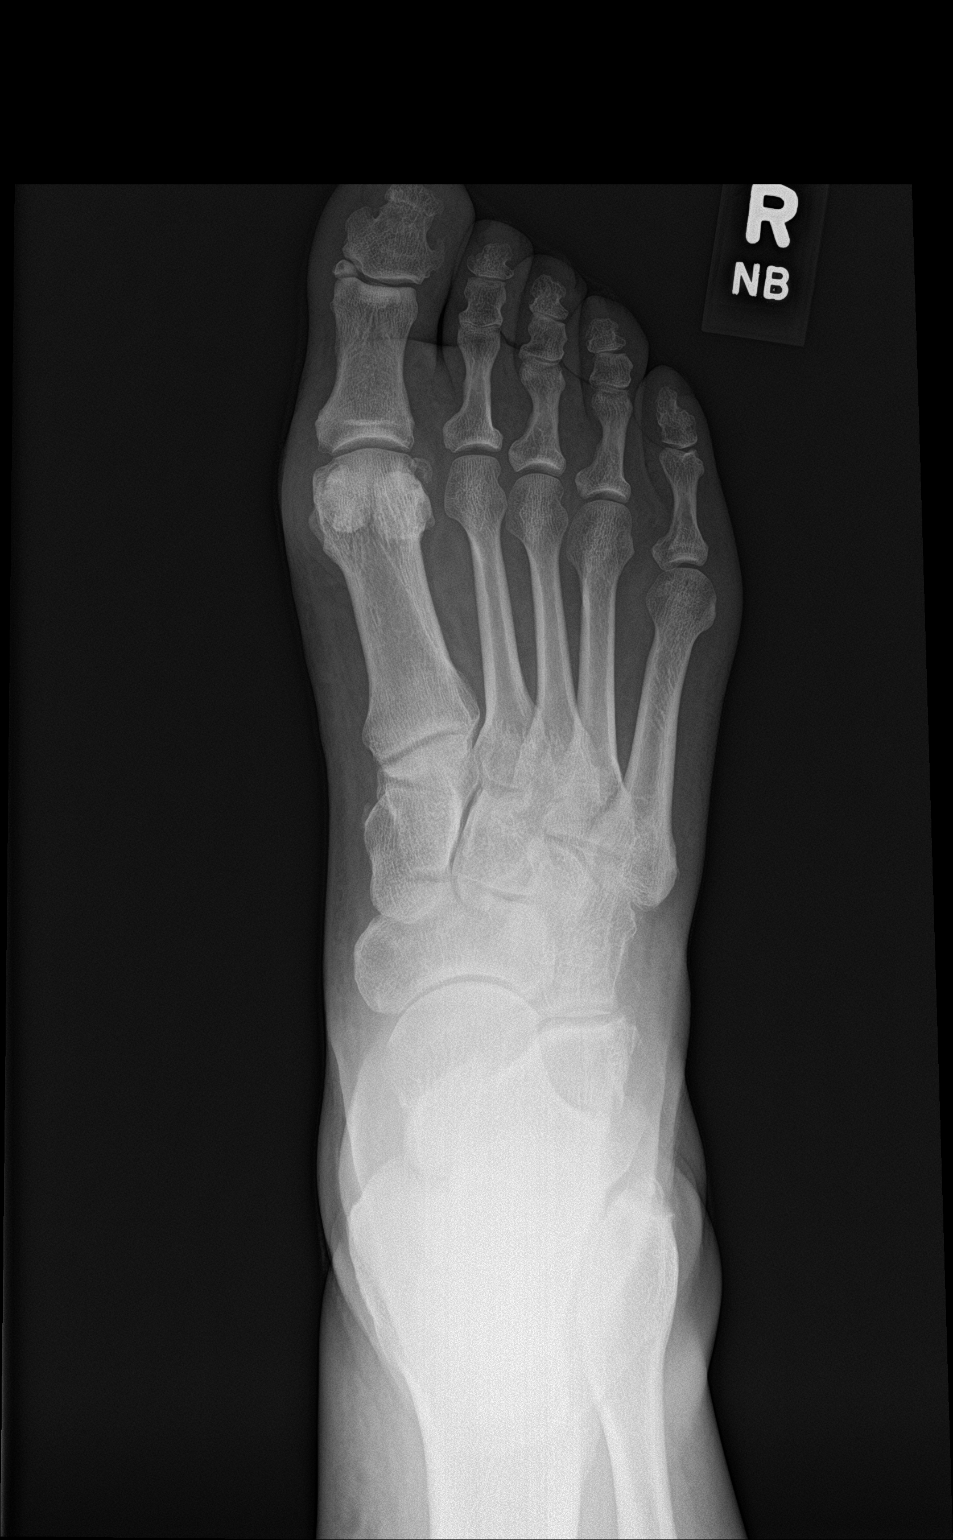

[foot obl wb]
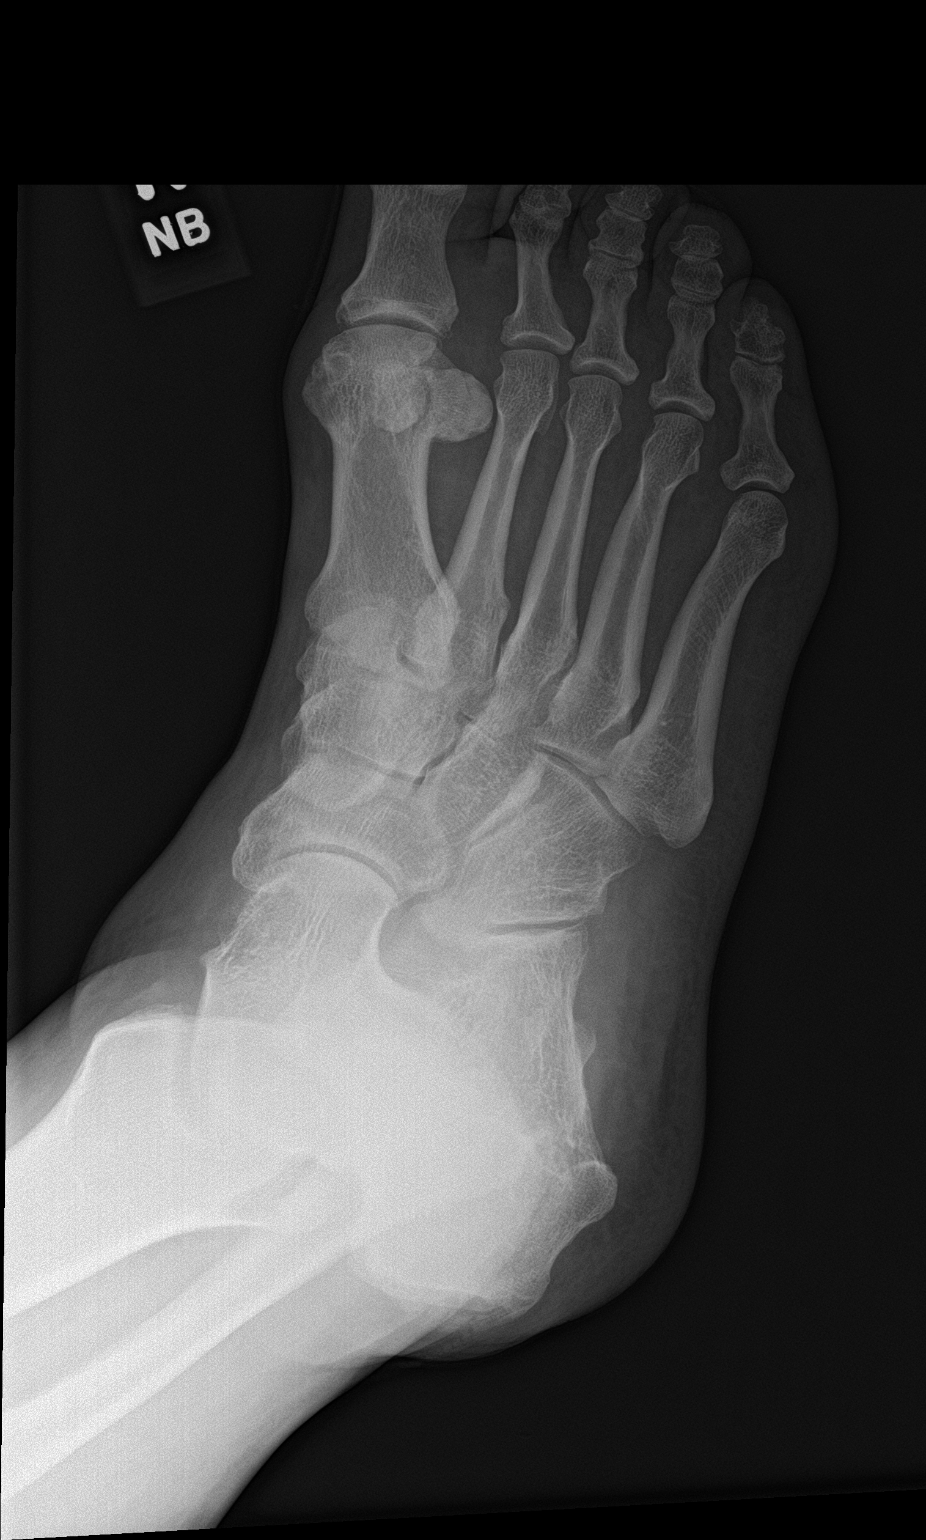

[foot lat wb]
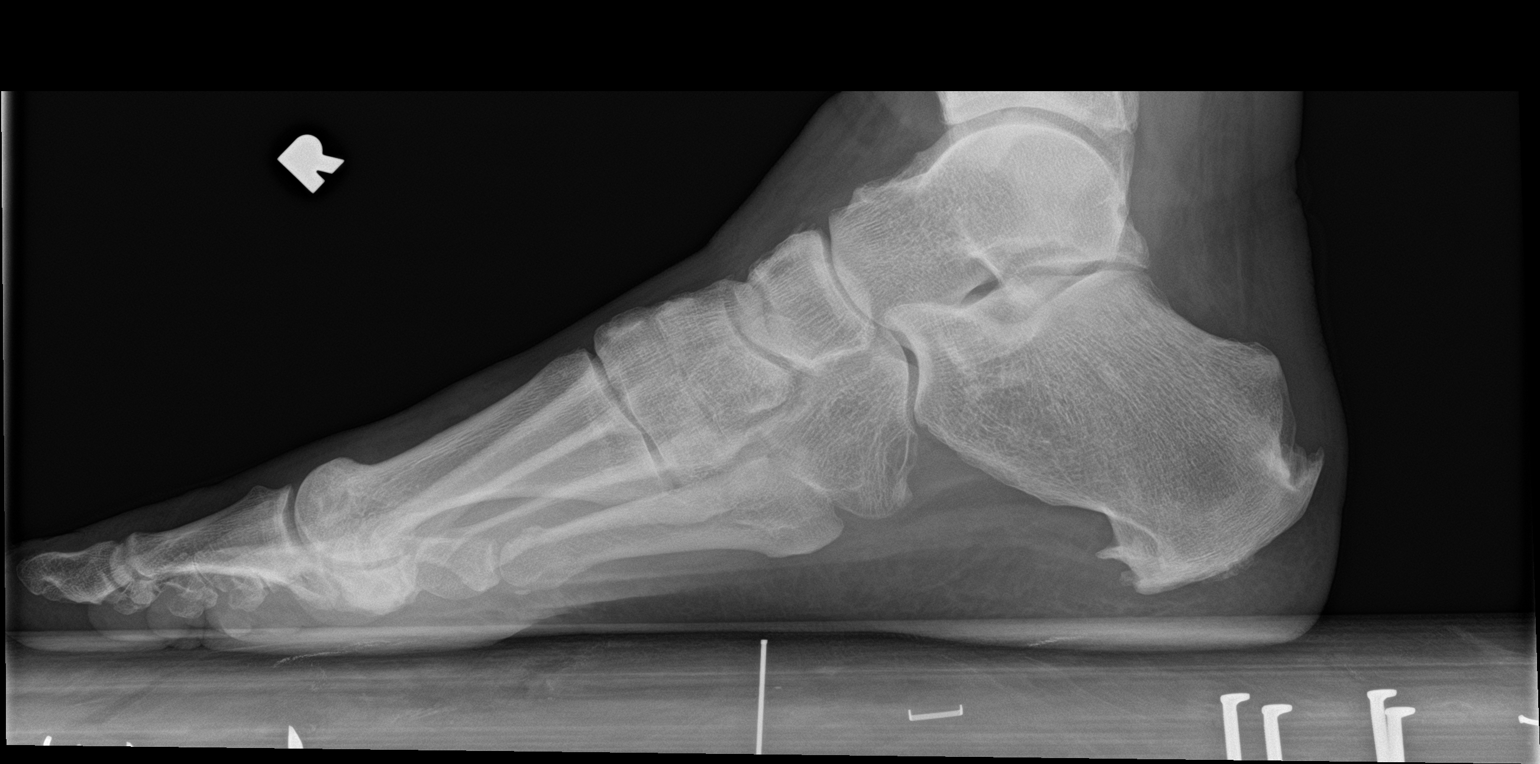

[3 of 3 positions shown; findings below may reference images not displayed]

FINDINGS: Right foot:

Mild great toe metatarsophalangeal joint space narrowing. Mild joint
space narrowing of the interphalangeal joints diffusely. Chronic
well corticated ossicle at the far medial aspect of the great toe
interphalangeal joint, degenerative. Mild-to-moderate second and
third tarsometatarsal joint space narrowing. Mild posterior and
plantar calcaneal heel spurs. No acute fracture is seen. No
dislocation.

Left foot:

Minimal great toe metatarsophalangeal joint space narrowing and
peripheral osteophytosis. Mild joint space narrowing of the
interphalangeal joints diffusely. Mild second and third
tarsometatarsal joint space narrowing. Moderate plantar and small
posterior calcaneal heel spurs. Mild dorsal talonavicular
degenerative osteophytes. No acute fracture or dislocation.
IMPRESSION: Moderate left and small right plantar calcaneal heel spurs.

Small bilateral posterior calcaneal heel spurs at the Achilles
tendon insertions.

## 2023-07-03 DIAGNOSIS — Z0000 Encounter for general adult medical examination without abnormal findings: Secondary | ICD-10-CM | POA: Diagnosis not present

## 2023-07-03 DIAGNOSIS — I10 Essential (primary) hypertension: Secondary | ICD-10-CM | POA: Diagnosis not present

## 2023-07-03 DIAGNOSIS — Z23 Encounter for immunization: Secondary | ICD-10-CM | POA: Diagnosis not present

## 2023-07-03 DIAGNOSIS — E039 Hypothyroidism, unspecified: Secondary | ICD-10-CM | POA: Diagnosis not present

## 2023-07-03 DIAGNOSIS — E782 Mixed hyperlipidemia: Secondary | ICD-10-CM | POA: Diagnosis not present

## 2023-07-10 ENCOUNTER — Encounter: Payer: BC Managed Care – PPO | Admitting: Registered Nurse

## 2023-08-14 DIAGNOSIS — R319 Hematuria, unspecified: Secondary | ICD-10-CM | POA: Diagnosis not present

## 2023-08-14 DIAGNOSIS — R310 Gross hematuria: Secondary | ICD-10-CM | POA: Diagnosis not present

## 2023-08-23 DIAGNOSIS — H903 Sensorineural hearing loss, bilateral: Secondary | ICD-10-CM | POA: Diagnosis not present

## 2023-09-04 DIAGNOSIS — H903 Sensorineural hearing loss, bilateral: Secondary | ICD-10-CM | POA: Diagnosis not present

## 2023-09-11 ENCOUNTER — Encounter
Payer: BC Managed Care – PPO | Attending: Physical Medicine & Rehabilitation | Admitting: Physical Medicine & Rehabilitation

## 2023-09-11 ENCOUNTER — Encounter: Payer: Self-pay | Admitting: Physical Medicine & Rehabilitation

## 2023-09-11 VITALS — BP 131/69 | HR 66 | Ht 77.0 in | Wt 298.4 lb

## 2023-09-11 DIAGNOSIS — F39 Unspecified mood [affective] disorder: Secondary | ICD-10-CM | POA: Insufficient documentation

## 2023-09-11 DIAGNOSIS — M545 Low back pain, unspecified: Secondary | ICD-10-CM | POA: Diagnosis not present

## 2023-09-11 DIAGNOSIS — G8929 Other chronic pain: Secondary | ICD-10-CM | POA: Insufficient documentation

## 2023-09-11 NOTE — Progress Notes (Signed)
Subjective:    Patient ID: Mike Shaw, male    DOB: 16-May-1971, 52 y.o.   MRN: [id number]  HPI   HPI 04/01/22 MR Staver is a very pleasant 52 year old male with past medical history of plantar fasciitis, asthma Hypertension, OSA who is here for chronic lower back pain.  Years however it has been worsening over the last 5 to 6 years.  Pain is primarily on the right side of his lower back and is worsened by activities.  Activities, bending twisting, walking will worsen his pain.  Sometimes he has shooting electric type pains that travel down his back.  He also has some numbness over his left thigh in the distribution of the lateral femoral cutaneous nerve.  He has been seen in the past for leg length discrepancy with shorter leg on the right.  He has had this for many years and previously wore inserts in his shoes.  A few years ago orthopedics ordered for him to have shoe modifications because his leg length discrepancy was about an inch.  He never finished the process to get the shoe adjustment.  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  He has tried meloxicam for this pain without much improvement.  Patient reports his mood is sometimes decreased no SI or HI.  He has taken Cymbalta in the past for decreased mood without any side effects.   Interval History 07/04/22 Mike Shaw is here for follow-up of his chronic back pain and leg length discrepancy.  He says he has had his right total shoe for about a month has not had any improvement in his back pain and is having increased tightness in his hamstrings especially on the right and his right knee.  Patient reports orthotist tested him with various blocks under his shoe into his pelvis was level, and added just a little less than an inch to his right shoes.  He says he is wearing his shoes all the time when he is ambulating.  He does report his planter fasciitis is improved with shoe inserts.   Patient says he tried the Cymbalta for about a week but discontinued it because he did not want to use the medication to mask the pain at the time.  Flexeril did not help his pain. He continues to have altered sensation in LLE in location of lateral femoral cutaneous. Mood is decreased related to pain. No SI or HI.    Interval history 08/15/2022  Mike Shaw is here for follow-up of his chronic back pain.  He has discontinued use of his built-up shoe for the right lower extremity as this was making his back pain worse and leg length x-rays did not show length discrepancy between his legs.  He reports benefit with duloxetine but continues to have pain in his back.  He has not tried using 60 mg dose and is still been using 30 mg dose.  Pain sometimes shoots into his thighs.  He continues to have altered sensation on his left lateral thigh.  He reports he has a very soft mattress that he thinks may be worsening his pain.  He also has poor lumbar support in his vehicle.  Robaxin has not provided benefit to his back pain.    Interval History 10/10/21 today Mike Shaw is here for follow-up regarding his chronic pain.  He continues to have pain in his back and and thighs.  Overall pain is  improved.  However he sometimes has good days and other days bad days.  He has been using the truck that has a different type of seat recently.  He also has not been doing as many long drive recently.  He has been doing some exercises at the [facility].  He will often feel sore immediately after doing exercises however he feels that his pain does improve the next day.  Interval history 09/11/2023. Patient reports he is doing well overall.  His pain has improved since her last visit.  He feels like this is related to him losing a significant amount of weight since her last visit.  He also got a different truck that is more comfortable.  He reports that orthopedics is following him for pain and stiffness in his left knee.  Reports he  had an MRI completed at his Ortho clinic.  His back continues to feel tight at times.  Sometimes his neck will feel sore.  He has discontinued all his medications because his pain is improved and he did not want to take any unnecessary medications.   Pain Inventory Average Pain 5 Pain Right Now 7 My pain is constant, burning, dull, and aching  In the last 24 hours, has pain interfered with the following? General activity 8 Relation with others 4 Enjoyment of life 5 What TIME of day is your pain at its worst? morning , daytime, evening, and night Sleep (in general) Fair  Pain is worse with: walking, bending, inactivity, and standing Pain improves with: heat/ice and TENS Relief from Meds: 3  Family History  Problem Relation Age of Onset   Asthma Father    Emphysema Father        was a smoker   Prostate cancer Father    Heart disease Paternal Grandfather    Lung disease Paternal Aunt    Lung disease Paternal Uncle    Social History   Socioeconomic History   Marital status: Divorced    Spouse name: Not on file   Number of children: 2   Years of education: Not on file   Highest education level: Not on file  Occupational History   Occupation: Publishing copy  Tobacco Use   Smoking status: Never   Smokeless tobacco: Never  Vaping Use   Vaping status: Never Used  Substance and Sexual Activity   Alcohol use: No   Drug use: No   Sexual activity: Not on file  Other Topics Concern   Not on file  Social History Narrative   Not on file   Social Determinants of Health   Financial Resource Strain: Not on file  Food Insecurity: Not on file  Transportation Needs: Not on file  Physical Activity: Not on file  Stress: Not on file  Social Connections: Unknown (02/12/2022)   Received from [facility]   Social Network    Social Network: Not on file   Past Surgical History:  Procedure Laterality Date   NASAL SINUS SURGERY  2000   Past Surgical History:  Procedure Laterality  Date   NASAL SINUS SURGERY  2000   Past Medical History:  Diagnosis Date   Asthma    Hyperlipidemia    Hypertension    OSA (obstructive sleep apnea)    BP 131/69   Pulse 66   Ht 6\' 5"  (1.956 m)   Wt 298 lb 6.4 oz (135.4 kg)   SpO2 98%   BMI 35.39 kg/m   Opioid Risk Score:   Fall Risk Score:  `  1  Depression screen [facility] 2/9     09/11/2023   10:07 AM 10/10/2022   10:17 AM 08/15/2022    1:05 PM 07/04/2022    1:08 PM 04/01/2022    9:52 AM  Depression screen PHQ 2/9  Decreased Interest 3 1 1 1 3   Down, Depressed, Hopeless 3 1 1 1 1   PHQ - 2 Score 6 2 2 2 4   Altered sleeping     1  Tired, decreased energy     1  Change in appetite     2  Feeling bad or failure about yourself      1  Trouble concentrating     3  Moving slowly or fidgety/restless     0  Suicidal thoughts     0  PHQ-9 Score     12  Difficult doing work/chores     Very difficult    Review of Systems  Constitutional: Negative.   Eyes: Negative.   Respiratory: Negative.    Cardiovascular: Negative.   Gastrointestinal: Negative.   Endocrine: Negative.   Musculoskeletal:  Positive for back pain.       Left leg pain, right foot pain  Skin: Negative.   Allergic/Immunologic: Negative.   Neurological: Negative.   Hematological: Negative.   Psychiatric/Behavioral:  Positive for dysphoric mood.   All other systems reviewed and are negative.      Objective:   Physical Exam   Physical Exam Gen: no distress, normal appearing HEENT: oral mucosa pink and moist, NCAT Chest: normal effort, normal rate of breathing Abd: soft, non-distended,obese Ext: no edema Psych: pleasant, normal affect Skin: intact Neuro: Follows commands sensation intact in all 4 extremities Altered sensation noted over distribution of left lateral femoral cutaneous nerve  No focal motor deficits musculoskeletal:  Lumbar paraspinal tenderness mild today Mild C spine tenderness  Slump test negative Facet loading-mild pain on  right + pain with spinal extension   02/14/2022 Segmentation: 5 non rib-bearing lumbar type vertebral bodies are present. The lowest fully formed vertebral body is L5.   Alignment:  No significant listhesis is present.   Vertebrae: Edematous endplate marrow changes are noted in the anterior inferior aspect of T12. Marrow signal and vertebral body heights are otherwise normal.   Conus medullaris and cauda equina: Conus extends to the T12 level. Conus and cauda equina appear normal.   Paraspinal and other soft tissues: Limited imaging the abdomen is unremarkable. There is no significant adenopathy. No solid organ lesions are present.   Disc levels:   L1-2: A broad-based disc protrusion has progressed. Mild foraminal narrowing is worse left than right. The central canal is patent.   L2-3: Mild disc bulging and facet hypertrophy is present without significant stenosis.   L3-4: Progressive moderate facet hypertrophy is worse on the right. Mild broad-based disc protrusion is present. Mild bilateral foraminal narrowing is present.   L4-5: A leftward disc protrusion is present. Moderate facet hypertrophy is noted. This results in mild left subarticular and moderate left foraminal stenosis. Far left annular tear is noted.   L5-S1: Negative.   IMPRESSION: 1. Progressive multilevel spondylosis of the lumbar spine as described. 2. Mild foraminal narrowing bilaterally at L1-2 is worse on the left. 3. Mild bilateral foraminal narrowing at L3-4 is worse on the right. 4. Mild left subarticular and moderate left foraminal stenosis at L4-5 with a far left annular tear.     Assessment & Plan:  Lower back pain chronic without sciatica, multilevel lumbar spondylosis -Pt  discontinued duloxetine 60 mg daily since last visit  -PRN Skelaxin-pt no longer taking this  -Discussed PT order- pt will let me know if his schedule will allow him to do this -Suspect back pain may be related to facet  joint degeneration in his lumbar spine, appears improved today.  Discussed injection options if pain not controlled with more conservative techniques. 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

## 2023-10-02 DIAGNOSIS — R197 Diarrhea, unspecified: Secondary | ICD-10-CM | POA: Diagnosis not present

## 2023-10-02 DIAGNOSIS — R1033 Periumbilical pain: Secondary | ICD-10-CM | POA: Diagnosis not present

## 2023-10-02 DIAGNOSIS — K529 Noninfective gastroenteritis and colitis, unspecified: Secondary | ICD-10-CM | POA: Diagnosis not present

## 2023-10-02 DIAGNOSIS — R1110 Vomiting, unspecified: Secondary | ICD-10-CM | POA: Diagnosis not present

## 2023-10-02 DIAGNOSIS — N179 Acute kidney failure, unspecified: Secondary | ICD-10-CM | POA: Diagnosis not present

## 2023-10-02 DIAGNOSIS — R112 Nausea with vomiting, unspecified: Secondary | ICD-10-CM | POA: Diagnosis not present

## 2023-12-18 DIAGNOSIS — E782 Mixed hyperlipidemia: Secondary | ICD-10-CM | POA: Diagnosis not present

## 2023-12-18 DIAGNOSIS — E039 Hypothyroidism, unspecified: Secondary | ICD-10-CM | POA: Diagnosis not present

## 2023-12-18 DIAGNOSIS — K219 Gastro-esophageal reflux disease without esophagitis: Secondary | ICD-10-CM | POA: Diagnosis not present

## 2023-12-18 DIAGNOSIS — I10 Essential (primary) hypertension: Secondary | ICD-10-CM | POA: Diagnosis not present

## 2024-02-09 DIAGNOSIS — E039 Hypothyroidism, unspecified: Secondary | ICD-10-CM | POA: Diagnosis not present

## 2024-02-23 DIAGNOSIS — Z860101 Personal history of adenomatous and serrated colon polyps: Secondary | ICD-10-CM | POA: Diagnosis not present

## 2024-02-23 DIAGNOSIS — Z09 Encounter for follow-up examination after completed treatment for conditions other than malignant neoplasm: Secondary | ICD-10-CM | POA: Diagnosis not present

## 2024-03-11 ENCOUNTER — Ambulatory Visit: Payer: BC Managed Care – PPO | Admitting: Physical Medicine & Rehabilitation

## 2024-04-16 DIAGNOSIS — E039 Hypothyroidism, unspecified: Secondary | ICD-10-CM | POA: Diagnosis not present

## 2024-04-16 DIAGNOSIS — G4733 Obstructive sleep apnea (adult) (pediatric): Secondary | ICD-10-CM | POA: Diagnosis not present

## 2024-04-25 DIAGNOSIS — G4733 Obstructive sleep apnea (adult) (pediatric): Secondary | ICD-10-CM | POA: Diagnosis not present

## 2024-04-29 ENCOUNTER — Encounter: Admitting: Physical Medicine & Rehabilitation

## 2024-05-26 DIAGNOSIS — G4733 Obstructive sleep apnea (adult) (pediatric): Secondary | ICD-10-CM | POA: Diagnosis not present

## 2024-06-18 ENCOUNTER — Encounter: Admitting: Physical Medicine & Rehabilitation

## 2024-07-12 DIAGNOSIS — E782 Mixed hyperlipidemia: Secondary | ICD-10-CM | POA: Diagnosis not present

## 2024-07-12 DIAGNOSIS — I10 Essential (primary) hypertension: Secondary | ICD-10-CM | POA: Diagnosis not present

## 2024-07-12 DIAGNOSIS — Z0000 Encounter for general adult medical examination without abnormal findings: Secondary | ICD-10-CM | POA: Diagnosis not present

## 2024-07-12 DIAGNOSIS — E039 Hypothyroidism, unspecified: Secondary | ICD-10-CM | POA: Diagnosis not present

## 2024-07-12 DIAGNOSIS — K219 Gastro-esophageal reflux disease without esophagitis: Secondary | ICD-10-CM | POA: Diagnosis not present

## 2024-07-12 DIAGNOSIS — Z23 Encounter for immunization: Secondary | ICD-10-CM | POA: Diagnosis not present

## 2024-07-12 DIAGNOSIS — Z125 Encounter for screening for malignant neoplasm of prostate: Secondary | ICD-10-CM | POA: Diagnosis not present

## 2024-07-28 DIAGNOSIS — G4733 Obstructive sleep apnea (adult) (pediatric): Secondary | ICD-10-CM | POA: Diagnosis not present

## 2024-08-09 DIAGNOSIS — F39 Unspecified mood [affective] disorder: Secondary | ICD-10-CM | POA: Diagnosis not present

## 2024-08-09 DIAGNOSIS — E291 Testicular hypofunction: Secondary | ICD-10-CM | POA: Diagnosis not present

## 2024-08-09 DIAGNOSIS — R3912 Poor urinary stream: Secondary | ICD-10-CM | POA: Diagnosis not present

## 2024-08-09 DIAGNOSIS — N401 Enlarged prostate with lower urinary tract symptoms: Secondary | ICD-10-CM | POA: Diagnosis not present

## 2024-09-18 DIAGNOSIS — K529 Noninfective gastroenteritis and colitis, unspecified: Secondary | ICD-10-CM | POA: Diagnosis not present

## 2024-09-18 DIAGNOSIS — I10 Essential (primary) hypertension: Secondary | ICD-10-CM | POA: Diagnosis not present

## 2024-10-02 DIAGNOSIS — G4733 Obstructive sleep apnea (adult) (pediatric): Secondary | ICD-10-CM | POA: Diagnosis not present
